# Patient Record
Sex: Male | Born: 1970 | Race: White | Hispanic: No | Marital: Married | Smoking: Never smoker
Health system: Southern US, Community
[De-identification: ages and names within clinical notes are randomized; demographics above are authoritative.]

## PROBLEM LIST (undated history)

## (undated) DIAGNOSIS — Q7649 Other congenital malformations of spine, not associated with scoliosis: Secondary | ICD-10-CM

## (undated) DIAGNOSIS — G4730 Sleep apnea, unspecified: Secondary | ICD-10-CM

## (undated) DIAGNOSIS — M1990 Unspecified osteoarthritis, unspecified site: Secondary | ICD-10-CM

---

## 2001-01-01 ENCOUNTER — Ambulatory Visit (HOSPITAL_COMMUNITY): Admission: RE | Admit: 2001-01-01 | Discharge: 2001-01-01 | Payer: Self-pay | Admitting: Obstetrics and Gynecology

## 2018-12-22 ENCOUNTER — Inpatient Hospital Stay (HOSPITAL_COMMUNITY)
Admission: EM | Admit: 2018-12-22 | Discharge: 2018-12-27 | DRG: 177 | Disposition: A | Discharge status: Home / Self Care | Payer: 59 | Source: Ambulatory Visit | Attending: Family Medicine | Admitting: Family Medicine

## 2018-12-22 ENCOUNTER — Emergency Department (HOSPITAL_COMMUNITY): Payer: 59

## 2018-12-22 ENCOUNTER — Encounter (HOSPITAL_COMMUNITY): Payer: Self-pay

## 2018-12-22 ENCOUNTER — Other Ambulatory Visit: Payer: Self-pay

## 2018-12-22 DIAGNOSIS — T380X5A Adverse effect of glucocorticoids and synthetic analogues, initial encounter: Secondary | ICD-10-CM | POA: Diagnosis not present

## 2018-12-22 DIAGNOSIS — F419 Anxiety disorder, unspecified: Secondary | ICD-10-CM | POA: Diagnosis present

## 2018-12-22 DIAGNOSIS — J1289 Other viral pneumonia: Secondary | ICD-10-CM | POA: Diagnosis present

## 2018-12-22 DIAGNOSIS — R079 Chest pain, unspecified: Secondary | ICD-10-CM

## 2018-12-22 DIAGNOSIS — Q7649 Other congenital malformations of spine, not associated with scoliosis: Secondary | ICD-10-CM | POA: Diagnosis not present

## 2018-12-22 DIAGNOSIS — U071 COVID-19: Principal | ICD-10-CM | POA: Insufficient documentation

## 2018-12-22 DIAGNOSIS — Z23 Encounter for immunization: Secondary | ICD-10-CM | POA: Diagnosis not present

## 2018-12-22 DIAGNOSIS — R0902 Hypoxemia: Secondary | ICD-10-CM | POA: Diagnosis not present

## 2018-12-22 DIAGNOSIS — Z885 Allergy status to narcotic agent status: Secondary | ICD-10-CM

## 2018-12-22 DIAGNOSIS — R739 Hyperglycemia, unspecified: Secondary | ICD-10-CM | POA: Diagnosis present

## 2018-12-22 DIAGNOSIS — G629 Polyneuropathy, unspecified: Secondary | ICD-10-CM | POA: Diagnosis present

## 2018-12-22 DIAGNOSIS — Z79899 Other long term (current) drug therapy: Secondary | ICD-10-CM | POA: Diagnosis not present

## 2018-12-22 DIAGNOSIS — M1990 Unspecified osteoarthritis, unspecified site: Secondary | ICD-10-CM | POA: Diagnosis present

## 2018-12-22 DIAGNOSIS — I517 Cardiomegaly: Secondary | ICD-10-CM | POA: Diagnosis present

## 2018-12-22 DIAGNOSIS — J9601 Acute respiratory failure with hypoxia: Secondary | ICD-10-CM | POA: Diagnosis present

## 2018-12-22 DIAGNOSIS — Z6838 Body mass index (BMI) 38.0-38.9, adult: Secondary | ICD-10-CM | POA: Diagnosis not present

## 2018-12-22 DIAGNOSIS — G4730 Sleep apnea, unspecified: Secondary | ICD-10-CM | POA: Diagnosis present

## 2018-12-22 DIAGNOSIS — R7303 Prediabetes: Secondary | ICD-10-CM | POA: Diagnosis present

## 2018-12-22 DIAGNOSIS — E669 Obesity, unspecified: Secondary | ICD-10-CM | POA: Diagnosis present

## 2018-12-22 DIAGNOSIS — R0602 Shortness of breath: Secondary | ICD-10-CM

## 2018-12-22 HISTORY — DX: Unspecified osteoarthritis, unspecified site: M19.90

## 2018-12-22 HISTORY — DX: Other congenital malformations of spine, not associated with scoliosis: Q76.49

## 2018-12-22 HISTORY — DX: Sleep apnea, unspecified: G47.30

## 2018-12-22 LAB — COMPREHENSIVE METABOLIC PANEL
ALT: 23 U/L (ref 0–44)
AST: 21 U/L (ref 15–41)
Albumin: 3.7 g/dL (ref 3.5–5.0)
Alkaline Phosphatase: 86 U/L (ref 38–126)
Anion gap: 9 (ref 5–15)
BUN: 8 mg/dL (ref 6–20)
CO2: 23 mmol/L (ref 22–32)
Calcium: 9.1 mg/dL (ref 8.9–10.3)
Chloride: 106 mmol/L (ref 98–111)
Creatinine, Ser: 0.74 mg/dL (ref 0.61–1.24)
GFR calc Af Amer: >60 mL/min (ref >60)
GFR calc non Af Amer: >60 mL/min (ref >60)
Glucose, Bld: 169 mg/dL — ABNORMAL HIGH (ref 70–99)
Potassium: 4.2 mmol/L (ref 3.5–5.1)
Sodium: 138 mmol/L (ref 135–145)
Total Bilirubin: 0.8 mg/dL (ref 0.3–1.2)
Total Protein: 6.8 g/dL (ref 6.5–8.1)

## 2018-12-22 LAB — CBC WITH DIFFERENTIAL/PLATELET
Abs Immature Granulocytes: 0.03 10*3/uL (ref 0.00–0.07)
Basophils Absolute: 0 10*3/uL (ref 0.0–0.1)
Basophils Relative: 0 %
Eosinophils Absolute: 0.1 10*3/uL (ref 0.0–0.5)
Eosinophils Relative: 1 %
HCT: 46.6 % (ref 39.0–52.0)
Hemoglobin: 15.4 g/dL (ref 13.0–17.0)
Immature Granulocytes: 0 %
Lymphocytes Relative: 15 %
Lymphs Abs: 1.2 10*3/uL (ref 0.7–4.0)
MCH: 29.8 pg (ref 26.0–34.0)
MCHC: 33 g/dL (ref 30.0–36.0)
MCV: 90.1 fL (ref 80.0–100.0)
Monocytes Absolute: 0.9 10*3/uL (ref 0.1–1.0)
Monocytes Relative: 11 %
Neutro Abs: 5.9 10*3/uL (ref 1.7–7.7)
Neutrophils Relative %: 73 %
Platelets: 168 10*3/uL (ref 150–400)
RBC: 5.17 MIL/uL (ref 4.22–5.81)
RDW: 11.8 % (ref 11.5–15.5)
WBC: 8.1 10*3/uL (ref 4.0–10.5)
nRBC: 0 % (ref 0.0–0.2)

## 2018-12-22 LAB — TROPONIN I (HIGH SENSITIVITY)
Troponin I (High Sensitivity): 3 ng/L (ref <18)
Troponin I (High Sensitivity): 6 ng/L (ref <18)

## 2018-12-22 LAB — D-DIMER, QUANTITATIVE: D-Dimer, Quant: 0.56 ug/mL-FEU — ABNORMAL HIGH (ref 0.00–0.50)

## 2018-12-22 LAB — FIBRINOGEN: Fibrinogen: 583 mg/dL — ABNORMAL HIGH (ref 210–475)

## 2018-12-22 LAB — C-REACTIVE PROTEIN: CRP: 7.7 mg/dL — ABNORMAL HIGH (ref <1.0)

## 2018-12-22 LAB — FERRITIN: Ferritin: 251 ng/mL (ref 24–336)

## 2018-12-22 LAB — LACTIC ACID, PLASMA
Lactic Acid, Venous: 1.4 mmol/L (ref 0.5–1.9)
Lactic Acid, Venous: 1.4 mmol/L (ref 0.5–1.9)

## 2018-12-22 LAB — LACTATE DEHYDROGENASE: LDH: 159 U/L (ref 98–192)

## 2018-12-22 LAB — PROCALCITONIN: Procalcitonin: <0.1 ng/mL

## 2018-12-22 LAB — TRIGLYCERIDES: Triglycerides: 205 mg/dL — ABNORMAL HIGH (ref <150)

## 2018-12-22 LAB — LIPASE, BLOOD: Lipase: 35 U/L (ref 11–51)

## 2018-12-22 LAB — SARS CORONAVIRUS 2 (TAT 6-24 HRS): SARS Coronavirus 2: POSITIVE — AB

## 2018-12-22 LAB — HIV ANTIBODY (ROUTINE TESTING W REFLEX): HIV Screen 4th Generation wRfx: NONREACTIVE

## 2018-12-22 MED ORDER — SODIUM CHLORIDE 0.9% FLUSH
3.0000 mL | Freq: Two times a day (BID) | INTRAVENOUS | Status: DC
  Administered 2018-12-23 – 2018-12-27 (×9): 3 mL via INTRAVENOUS

## 2018-12-22 MED ORDER — ASPIRIN EC 81 MG PO TBEC
81.0000 mg | DELAYED_RELEASE_TABLET | Freq: Two times a day (BID) | ORAL | Status: DC
  Administered 2018-12-22 – 2018-12-27 (×10): 81 mg via ORAL
  Filled 2018-12-22 (×10): qty 1

## 2018-12-22 MED ORDER — SODIUM CHLORIDE 0.9 % IV SOLN: 250.0000 mL | INTRAVENOUS | Status: DC | PRN

## 2018-12-22 MED ORDER — ONDANSETRON HCL 4 MG PO TABS: 4.0000 mg | ORAL_TABLET | Freq: Four times a day (QID) | ORAL | Status: DC | PRN

## 2018-12-22 MED ORDER — IOHEXOL 350 MG/ML SOLN
75.0000 mL | Freq: Once | INTRAVENOUS | Status: AC | PRN
  Administered 2018-12-22: 75 mL via INTRAVENOUS

## 2018-12-22 MED ORDER — METHYLPREDNISOLONE SODIUM SUCC 125 MG IJ SOLR
60.0000 mg | Freq: Three times a day (TID) | INTRAMUSCULAR | Status: DC
  Administered 2018-12-22 – 2018-12-23 (×3): 60 mg via INTRAVENOUS
  Filled 2018-12-22 (×3): qty 2

## 2018-12-22 MED ORDER — ONDANSETRON HCL 4 MG/2ML IJ SOLN: 4.0000 mg | Freq: Four times a day (QID) | INTRAMUSCULAR | Status: DC | PRN

## 2018-12-22 MED ORDER — SODIUM CHLORIDE 0.9% FLUSH: 3.0000 mL | INTRAVENOUS | Status: DC | PRN

## 2018-12-22 MED ORDER — ACETAMINOPHEN 500 MG PO TABS: 1000.0000 mg | ORAL_TABLET | Freq: Four times a day (QID) | ORAL | Status: DC | PRN

## 2018-12-22 MED ORDER — ACETAMINOPHEN 650 MG RE SUPP: 650.0000 mg | Freq: Four times a day (QID) | RECTAL | Status: DC | PRN

## 2018-12-22 MED ORDER — ACETAMINOPHEN 325 MG PO TABS
650.0000 mg | ORAL_TABLET | Freq: Four times a day (QID) | ORAL | Status: DC | PRN
  Administered 2018-12-22 – 2018-12-26 (×9): 650 mg via ORAL
  Filled 2018-12-22 (×9): qty 2

## 2018-12-22 MED ORDER — ENOXAPARIN SODIUM 40 MG/0.4ML ~~LOC~~ SOLN
40.0000 mg | SUBCUTANEOUS | Status: DC
  Administered 2018-12-22 – 2018-12-24 (×3): 40 mg via SUBCUTANEOUS
  Filled 2018-12-22 (×3): qty 0.4

## 2018-12-22 MED ORDER — CLONAZEPAM 1 MG PO TABS
1.0000 mg | ORAL_TABLET | Freq: Every evening | ORAL | Status: DC | PRN
  Administered 2018-12-22 – 2018-12-26 (×5): 1 mg via ORAL
  Filled 2018-12-22 (×2): qty 1
  Filled 2018-12-22: qty 2
  Filled 2018-12-22 (×2): qty 1

## 2018-12-22 MED ORDER — HYDROCODONE-ACETAMINOPHEN 5-325 MG PO TABS
1.0000 | ORAL_TABLET | ORAL | Status: DC | PRN
  Administered 2018-12-23: 1 via ORAL
  Administered 2018-12-23: 2 via ORAL
  Administered 2018-12-25: 1 via ORAL
  Filled 2018-12-22 (×2): qty 1
  Filled 2018-12-22: qty 2

## 2018-12-22 NOTE — ED Triage Notes (Signed)
Covid +, patient was called today and told to come to hospital r/t chest xray and went back today and had another chest xray and should come here

## 2018-12-22 NOTE — ED Notes (Signed)
Verified positive Covid result with records on [facility] with [facility].

## 2018-12-22 NOTE — ED Notes (Signed)
Transported to Ct scan

## 2018-12-22 NOTE — ED Provider Notes (Signed)
[place] EMERGENCY DEPARTMENT Provider Note   CSN: [id number] Arrival date & time: 12/22/18  1312     History   Chief Complaint Chief Complaint  Patient presents with   Shortness of Breath    HPI Jeffery Lloyd is a 48 y.o. male.     The history is provided by the patient and medical records. No language interpreter was used.  Shortness of Breath Severity:  Severe Onset quality:  Gradual Duration:  3 days Timing:  Constant Progression:  Worsening Chronicity:  New Context: URI   Relieved by:  Nothing Worsened by:  Deep breathing Ineffective treatments:  None tried Associated symptoms: chest pain, cough and sputum production   Associated symptoms: no abdominal pain, no diaphoresis, no fever, no headaches, no vomiting and no wheezing     No past medical history on file.  There are no active problems to display for this patient.     Home Medications    Prior to Admission medications   Not on File    Family History No family history on file.  Social History Social History   Tobacco Use   Smoking status: Not on file  Substance Use Topics   Alcohol use: Not on file   Drug use: Not on file     Allergies   Patient has no allergy information on record.   Review of Systems Review of Systems  Constitutional: Negative for chills, diaphoresis, fatigue and fever.  HENT: Negative for congestion.   Respiratory: Positive for cough, sputum production, chest tightness and shortness of breath. Negative for wheezing and stridor.   Cardiovascular: Positive for chest pain. Negative for palpitations and leg swelling.  Gastrointestinal: Negative for abdominal pain, constipation, diarrhea, nausea and vomiting.  Genitourinary: Negative for flank pain.  Musculoskeletal: Negative for back pain.  Neurological: Negative for headaches.  Psychiatric/Behavioral: Negative for agitation.  All other systems reviewed and are negative.    Physical  Exam Updated Vital Signs BP (!) 139/100    Pulse 98    Temp 98.3 F (36.8 C) (Oral)    Resp 18    SpO2 94%   Physical Exam Vitals signs and nursing note reviewed.  Constitutional:      General: He is not in acute distress.    Appearance: He is not ill-appearing, toxic-appearing or diaphoretic.  HENT:     Head: Normocephalic.     Mouth/Throat:     Mouth: Mucous membranes are moist.     Pharynx: No pharyngeal swelling or oropharyngeal exudate.  Eyes:     Pupils: Pupils are equal, round, and reactive to light.  Cardiovascular:     Rate and Rhythm: Regular rhythm. Tachycardia present.  Pulmonary:     Effort: Tachypnea present.     Breath sounds: No stridor. Examination of the right-lower field reveals rhonchi. Examination of the left-lower field reveals rhonchi. Rhonchi present. No decreased breath sounds or rales.  Chest:     Chest wall: No tenderness.  Musculoskeletal:     Right lower leg: He exhibits no tenderness. No edema.     Left lower leg: He exhibits no tenderness. No edema.  Skin:    General: Skin is warm.     Capillary Refill: Capillary refill takes less than 2 seconds.  Neurological:     General: No focal deficit present.     Mental Status: He is alert.      ED Treatments / Results  Labs (all labs ordered are listed, but only abnormal results  are displayed) Labs Reviewed  COMPREHENSIVE METABOLIC PANEL - Abnormal; Notable for the following components:      Result Value   Glucose, Bld 169 (*)    All other components within normal limits  CULTURE, BLOOD (ROUTINE X 2)  CULTURE, BLOOD (ROUTINE X 2)  CBC WITH DIFFERENTIAL/PLATELET  LIPASE, BLOOD  LACTIC ACID, PLASMA  LACTIC ACID, PLASMA  D-DIMER, QUANTITATIVE (NOT AT [facility])  PROCALCITONIN  LACTATE DEHYDROGENASE  FERRITIN  TRIGLYCERIDES  FIBRINOGEN  C-REACTIVE PROTEIN  TROPONIN I (HIGH SENSITIVITY)  TROPONIN I (HIGH SENSITIVITY)    EKG EKG Interpretation  Date/Time:  Sunday December 22 2018 13:31:43  EST Ventricular Rate:  118 PR Interval:    QRS Duration: 98 QT Interval:  312 QTC Calculation: 438 R Axis:   117 Text Interpretation: Sinus tachycardia LAE, consider biatrial enlargement Right ventricular hypertrophy Baseline wander in lead(s) V1 No prior ECG for comparison. No STEMI Confirmed by Theda Belfastegeler, Chris ([id number]) on 12/22/2018 1:35:36 PM   Radiology Ct Angio Chest Pe W And/or Wo Contrast  Result Date: 12/22/2018 CLINICAL DATA:  Shortness of breath, COVID-19 positive, abnormal chest x-ray EXAM: CT ANGIOGRAPHY CHEST WITH CONTRAST TECHNIQUE: Multidetector CT imaging of the chest was performed using the standard protocol during bolus administration of intravenous contrast. Multiplanar CT image reconstructions and MIPs were obtained to evaluate the vascular anatomy. CONTRAST:  75mL OMNIPAQUE IOHEXOL 350 MG/ML SOLN COMPARISON:  Same day chest radiograph FINDINGS: Cardiovascular: Satisfactory opacification of the pulmonary arteries to the segmental level. No evidence of pulmonary embolism. Normal heart size. No pericardial effusion. Mediastinum/Nodes: No enlarged mediastinal, hilar, or axillary lymph nodes. Thyroid gland, trachea, and esophagus demonstrate no significant findings. Lungs/Pleura: There is extensive bilateral heterogeneous and ground-glass nodule airspace opacity, many opacities in a subpleural distribution. No pleural effusion or pneumothorax. Upper Abdomen: No acute abnormality. Musculoskeletal: No chest wall abnormality. 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 Review of the MIP images confirms the above findings. IMPRESSION: 1.  Negative examination for pulmonary embolism. 2. There is extensive bilateral heterogeneous and ground-glass nodule airspace opacity, many opacities in a subpleural distribution, airspace disease generally in keeping with reported diagnosis of COVID-19. Electronically Signed   By: Lauralyn PrimesAlex  Bibbey M.D.   On: 12/22/2018 15:26     Procedures Procedures (including critical care time)  Jeffery ParmaMichael Sliva was evaluated in Emergency Department on 12/22/2018 for the symptoms described in the history of present illness. He was evaluated in the context of the global COVID-19 pandemic, which necessitated consideration that the patient might be at risk for infection with the SARS-CoV-2 virus that causes COVID-19. Institutional protocols and algorithms that pertain to the evaluation of patients at risk for COVID-19 are in a state of rapid change based on information released by regulatory bodies including the CDC and federal and state organizations. These policies and algorithms were followed during the patient's care in the ED.   Medications Ordered in ED Medications  iohexol (OMNIPAQUE) 350 MG/ML injection 75 mL (75 mLs Intravenous Contrast Given 12/22/18 1514)     Initial Impression / Assessment and Plan / ED Course  I have reviewed the triage vital signs and the nursing notes.  Pertinent labs & imaging results that were available during my care of the patient were reviewed by me and considered in my medical decision making (see chart for details).        Jeffery ParmaMichael Lloyd is a 48 y.o. male recent diagnosis of COVID-19 who presents from PCP for further evaluation and rule out of  pulmonary embolism with worsening chest x-ray.  Patient reports that he was diagnosed with COVID-19 several days ago and the rest of his family are all infected.  He reports that he has a pulse oximeter at home and was found to have oxygen saturations dropping into the mid 80s today.  He reports shortness of breath was worsening and a chest x-ray performed today at PCP showed worsening bilateral lung abnormalities.  They were concerned about development of pulmonary embolism given the new left-sided chest pain, shortness of breath, hypoxia, tachycardia, and tachypnea.  Patient reports no nausea, vomiting, diaphoresis.  He denies any urinary symptoms or GI  symptoms.  He does not take oxygen at home and again had the oxygen saturations in the 80s previously.  On exam, patient does have some coarse breath sounds in the bases bilaterally right worse than left.  Chest was nontender.  Abdomen was nontender.  Good pulses in extremities.  Patient has some mild edema in his lower extremities which he reports is at his baseline.  His history of lower extremity surgeries most recently several months ago.  He denies history of DVT or PE and has had previous ultrasounds of his legs in the past.  EKG shows no STEMI but does show sinus tachycardia.  Clinically I am concerned about pneumonia versus pulmonary embolism versus symptomatic COVID-19 infection.  Will ambulate patient with pulse oximetry to see if he gets hypoxic given the hypoxia at home.  If he is hypoxic, anticipate admission regardless of imaging findings.  Anticipate reassessment after work-up.  Patient was ambulated in his room and oxygen saturation was around 90.  Heart rate did jump into the 150s and he was very symptomatic.  Given the low oxygen saturations in the 85% at home, patient will place on oxygen.  CT scan showed is no pulmonary motion.  Diffuse lung disease is seen with from COVID-19.  Due to new oxygen requirement and COVID-19 infection, patient will be admitted for further management oxygen supplementation.  Will defer to admitting team for antibiotics.  Final Clinical Impressions(s) / ED Diagnoses   Final diagnoses:  Hypoxia  SOB (shortness of breath)  Chest pain, unspecified type  COVID-19     Clinical Impression: 1. Hypoxia   2. SOB (shortness of breath)   3. Chest pain, unspecified type   4. COVID-19     Disposition: Admit  This note was prepared with assistance of Dragon voice recognition software. Occasional wrong-word or sound-a-like substitutions may have occurred due to the inherent limitations of voice recognition software.      Gearldene Fiorenza, Canary Brim,  MD 12/22/18 [phone]

## 2018-12-22 NOTE — ED Notes (Signed)
Called [facility] for transport to [facility] 

## 2018-12-22 NOTE — H&P (Signed)
[facility]                                                                                    Patient Demographics  Jeffery Lloyd, is a 48 y.o. male  CSN: [id number]  MRN: [id number]  DOB - 24-Jan-1971  Admit Date - 12/22/2018  Outpatient Primary MD for the patient is Patient, No Pcp Per   With History of -  Past Medical History:  Diagnosis Date  . Arthritis   . Sacral agenesis         in for   Chief Complaint  Patient presents with  . Shortness of Breath     HPI  Jeffery Lloyd  is a 48 y.o. male, with past medical history significant for arthritis and sacral agenesis and recent diagnosis of COVID-19 on Monday, sent from [place] office for evaluation for hypoxemia, noted today.  Chest x-ray at his PCPs office showed worsening bilateral lung abnormalities from a previous chest x-ray.  Patient reports left-sided pleuritic chest pain with shortness of breath.  No nausea vomiting or diarrhea reported.  Symptoms improved on nasal cannula BC and BMP were within normal, chest CT was negative for pulmonary embolism with extensive bilateral heterogeneous and groundglass nodule airspace opacities suggestive of COVID-19 .  Patient was noted to be tachypneic and tachycardic in the ER     Review of Systems    In addition to the HPI above,  Reports feverishness at home No Headache, No changes with Vision or hearing, No problems swallowing food or Liquids, No Abdominal pain, No Nausea or Vommitting, Bowel movements are regular, No Blood in stool or Urine, No dysuria, No new skin rashes or bruises, No new joints pains-aches,  No new weakness, tingling, numbness in any extremity, No recent weight gain or loss, No polyuria, polydypsia or polyphagia, No significant Mental Stressors.  A full 10 point Review of Systems was done, except as stated above, all other Review of Systems were negative.   Social History Social History   Tobacco Use  . Smoking status: Never  Smoker  Substance Use Topics  . Alcohol use: Yes    Alcohol/week: 2.0 standard drinks    Types: 2 Cans of beer per week     Family History No family history on file.   Prior to Admission medications   Medication Sig Start Date End Date Taking? Authorizing Provider  acetaminophen (TYLENOL) 500 MG tablet Take 1,000 mg by mouth every 6 (six) hours as needed for mild pain or headache.   Yes [provider]  cholecalciferol (VITAMIN D3) 25 MCG (1000 UT) tablet Take 2,000 Units by mouth daily.   Yes [provider]  clonazePAM (KLONOPIN) 1 MG tablet Take 1 mg by mouth at bedtime as needed for sleep. 07/10/18  Yes [provider]  dextromethorphan-guaiFENesin (MUCINEX DM) 30-600 MG 12hr tablet Take 1 tablet by mouth 2 (two) times daily as needed for cough.   Yes [provider]  fexofenadine (ALLEGRA) 180 MG tablet Take 180 mg by mouth daily as needed for allergies or rhinitis.   Yes [provider]  ibuprofen (ADVIL) 200 MG tablet Take 600 mg by  mouth every 6 (six) hours as needed for headache or moderate pain.   Yes [provider]  meloxicam (MOBIC) 15 MG tablet Take 15 mg by mouth daily as needed for pain. 11/08/18  Yes [provider]  Omega-3 Fatty Acids (FISH OIL) 1000 MG CAPS Take 1,000 mg by mouth daily.   Yes [provider]  vitamin C (ASCORBIC ACID) 500 MG tablet Take 500 mg by mouth daily.   Yes [provider]  zinc gluconate 50 MG tablet Take 50 mg by mouth daily.   Yes [provider]    Allergies  Allergen Reactions  . Codeine     Physical Exam  Vitals  Blood pressure (!) 139/100, pulse 98, temperature 98.3 F (36.8 C), temperature source Oral, resp. rate 18, SpO2 94 %. General appearance, no acute distress, alert awake oriented x3 HEENT no jaundice or pallor, no facial deviation oral thrush Neck supple, no neck vein distention Chest scattered rhonchi  Heart normal S1-S2 Abdomen  soft, nontender, bowel sounds are present Extremities no clubbing cyanosis +1 edema reported Skin no rashes or ulcers Neuro gross nonfocal, patient moving all extremities     Data Review  CBC Recent Labs  Lab 12/22/18 1406  WBC 8.1  HGB 15.4  HCT 46.6  PLT 168  MCV 90.1  MCH 29.8  MCHC 33.0  RDW 11.8  LYMPHSABS 1.2  MONOABS 0.9  EOSABS 0.1  BASOSABS 0.0   ------------------------------------------------------------------------------------------------------------------  Chemistries  Recent Labs  Lab 12/22/18 1406  NA 138  K 4.2  CL 106  CO2 23  GLUCOSE 169*  BUN 8  CREATININE 0.74  CALCIUM 9.1  AST 21  ALT 23  ALKPHOS 86  BILITOT 0.8   ------------------------------------------------------------------------------------------------------------------ CrCl cannot be calculated (Unknown ideal weight.). ------------------------------------------------------------------------------------------------------------------ No results for input(s): TSH, T4TOTAL, T3FREE, THYROIDAB in the last 72 hours.  Invalid input(s): FREET3   Coagulation profile No results for input(s): INR, PROTIME in the last 168 hours. ------------------------------------------------------------------------------------------------------------------- No results for input(s): DDIMER in the last 72 hours. -------------------------------------------------------------------------------------------------------------------  Cardiac Enzymes No results for input(s): CKMB, TROPONINI, MYOGLOBIN in the last 168 hours.  Invalid input(s): CK ------------------------------------------------------------------------------------------------------------------ Invalid input(s): POCBNP   ---------------------------------------------------------------------------------------------------------------  Urinalysis No results found for: COLORURINE, APPEARANCEUR, LABSPEC, PHURINE, GLUCOSEU, HGBUR, BILIRUBINUR,  KETONESUR, PROTEINUR, UROBILINOGEN, NITRITE, LEUKOCYTESUR  ----------------------------------------------------------------------------------------------------------------   Imaging results:   Ct Angio Chest Pe W And/or Wo Contrast  Result Date: 12/22/2018 CLINICAL DATA:  Shortness of breath, COVID-19 positive, abnormal chest x-ray EXAM: CT ANGIOGRAPHY CHEST WITH CONTRAST TECHNIQUE: Multidetector CT imaging of the chest was performed using the standard protocol during bolus administration of intravenous contrast. Multiplanar CT image reconstructions and MIPs were obtained to evaluate the vascular anatomy. CONTRAST:  75mL OMNIPAQUE IOHEXOL 350 MG/ML SOLN COMPARISON:  Same day chest radiograph FINDINGS: Cardiovascular: Satisfactory opacification of the pulmonary arteries to the segmental level. No evidence of pulmonary embolism. Normal heart size. No pericardial effusion. Mediastinum/Nodes: No enlarged mediastinal, hilar, or axillary lymph nodes. Thyroid gland, trachea, and esophagus demonstrate no significant findings. Lungs/Pleura: There is extensive bilateral heterogeneous and ground-glass nodule airspace opacity, many opacities in a subpleural distribution. No pleural effusion or pneumothorax. Upper Abdomen: No acute abnormality. Musculoskeletal: No chest wall abnormality. 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 Review of the MIP images confirms the above findings. IMPRESSION: 1.  Negative examination for pulmonary embolism. 2. There is extensive bilateral heterogeneous and ground-glass nodule airspace opacity, many opacities in a subpleural distribution, airspace disease generally in keeping with  reported diagnosis of COVID-19. Electronically Signed   By: Lauralyn Primes M.D.   On: 12/22/2018 15:26    My personal review of EKG: Sinus tach at 180 bpm with right ventricular hypertrophy  Assessment & Plan  COVID-19 pneumonia with new oxygen requirement Will place on steroids and  transfer to [facility]  Arthritis Continue with pain control  Hyperglycemia Start on carb modified diet    DVT Prophylaxis Lovenox AM Labs Ordered, also please review Full Orders  Family Communication:   Code Status full  Disposition Plan: Home  Time spent in minutes : 42 minutes  Condition GUARDED   @SIGNATURE @

## 2018-12-22 NOTE — ED Notes (Signed)
Ambulated patient, o2 sats remained 93%.  HR increased to 148 during ambulation.

## 2018-12-23 ENCOUNTER — Other Ambulatory Visit: Payer: Self-pay

## 2018-12-23 ENCOUNTER — Encounter (HOSPITAL_COMMUNITY): Payer: Self-pay

## 2018-12-23 DIAGNOSIS — R0902 Hypoxemia: Secondary | ICD-10-CM

## 2018-12-23 LAB — BASIC METABOLIC PANEL
Anion gap: 12 (ref 5–15)
BUN: 14 mg/dL (ref 6–20)
CO2: 24 mmol/L (ref 22–32)
Calcium: 9.3 mg/dL (ref 8.9–10.3)
Chloride: 102 mmol/L (ref 98–111)
Creatinine, Ser: 0.68 mg/dL (ref 0.61–1.24)
GFR calc Af Amer: >60 mL/min (ref >60)
GFR calc non Af Amer: >60 mL/min (ref >60)
Glucose, Bld: 171 mg/dL — ABNORMAL HIGH (ref 70–99)
Potassium: 4.2 mmol/L (ref 3.5–5.1)
Sodium: 138 mmol/L (ref 135–145)

## 2018-12-23 LAB — HEMOGLOBIN A1C
Hgb A1c MFr Bld: 5.9 % — ABNORMAL HIGH (ref 4.8–5.6)
Mean Plasma Glucose: 122.63 mg/dL

## 2018-12-23 LAB — CBC WITH DIFFERENTIAL/PLATELET
Abs Immature Granulocytes: 0.06 10*3/uL (ref 0.00–0.07)
Basophils Absolute: 0 10*3/uL (ref 0.0–0.1)
Basophils Relative: 0 %
Eosinophils Absolute: 0 10*3/uL (ref 0.0–0.5)
Eosinophils Relative: 0 %
HCT: 48.6 % (ref 39.0–52.0)
Hemoglobin: 15.7 g/dL (ref 13.0–17.0)
Immature Granulocytes: 1 %
Lymphocytes Relative: 10 %
Lymphs Abs: 1.1 10*3/uL (ref 0.7–4.0)
MCH: 29.1 pg (ref 26.0–34.0)
MCHC: 32.3 g/dL (ref 30.0–36.0)
MCV: 90.2 fL (ref 80.0–100.0)
Monocytes Absolute: 0.3 10*3/uL (ref 0.1–1.0)
Monocytes Relative: 2 %
Neutro Abs: 9.2 10*3/uL — ABNORMAL HIGH (ref 1.7–7.7)
Neutrophils Relative %: 87 %
Platelets: 232 10*3/uL (ref 150–400)
RBC: 5.39 MIL/uL (ref 4.22–5.81)
RDW: 11.9 % (ref 11.5–15.5)
WBC: 10.6 10*3/uL — ABNORMAL HIGH (ref 4.0–10.5)
nRBC: 0 % (ref 0.0–0.2)

## 2018-12-23 LAB — FERRITIN: Ferritin: 256 ng/mL (ref 24–336)

## 2018-12-23 LAB — SAMPLE TO BLOOD BANK

## 2018-12-23 LAB — GLUCOSE, CAPILLARY
Glucose-Capillary: 237 mg/dL — ABNORMAL HIGH (ref 70–99)
Glucose-Capillary: 238 mg/dL — ABNORMAL HIGH (ref 70–99)

## 2018-12-23 LAB — C-REACTIVE PROTEIN: CRP: 8.3 mg/dL — ABNORMAL HIGH (ref <1.0)

## 2018-12-23 LAB — D-DIMER, QUANTITATIVE: D-Dimer, Quant: 0.66 ug/mL-FEU — ABNORMAL HIGH (ref 0.00–0.50)

## 2018-12-23 MED ORDER — DEXAMETHASONE SODIUM PHOSPHATE 10 MG/ML IJ SOLN
6.0000 mg | INTRAMUSCULAR | Status: DC
  Administered 2018-12-24 – 2018-12-27 (×4): 6 mg via INTRAVENOUS
  Filled 2018-12-23 (×4): qty 1

## 2018-12-23 MED ORDER — VITAMIN C 500 MG PO TABS
1000.0000 mg | ORAL_TABLET | Freq: Every day | ORAL | Status: DC
  Administered 2018-12-23 – 2018-12-27 (×5): 1000 mg via ORAL
  Filled 2018-12-23 (×5): qty 2

## 2018-12-23 MED ORDER — MELATONIN 3 MG PO TABS
3.0000 mg | ORAL_TABLET | Freq: Every day | ORAL | Status: DC
  Administered 2018-12-23 – 2018-12-26 (×4): 3 mg via ORAL
  Filled 2018-12-23 (×6): qty 1

## 2018-12-23 MED ORDER — ZINC SULFATE 220 (50 ZN) MG PO CAPS
220.0000 mg | ORAL_CAPSULE | Freq: Every day | ORAL | Status: DC
  Administered 2018-12-23 – 2018-12-27 (×5): 220 mg via ORAL
  Filled 2018-12-23 (×5): qty 1

## 2018-12-23 MED ORDER — INSULIN ASPART 100 UNIT/ML ~~LOC~~ SOLN
0.0000 [IU] | Freq: Every day | SUBCUTANEOUS | Status: DC
  Administered 2018-12-23: 2 [IU] via SUBCUTANEOUS
  Administered 2018-12-24: 3 [IU] via SUBCUTANEOUS
  Administered 2018-12-25: 2 [IU] via SUBCUTANEOUS

## 2018-12-23 MED ORDER — LACTATED RINGERS IV BOLUS
500.0000 mL | Freq: Once | INTRAVENOUS | Status: AC
  Administered 2018-12-23: 500 mL via INTRAVENOUS

## 2018-12-23 MED ORDER — SODIUM CHLORIDE 0.9 % IV SOLN
100.0000 mg | INTRAVENOUS | Status: AC
  Administered 2018-12-24 – 2018-12-27 (×4): 100 mg via INTRAVENOUS
  Filled 2018-12-23: qty 100
  Filled 2018-12-23 (×3): qty 20

## 2018-12-23 MED ORDER — METHYLPREDNISOLONE SODIUM SUCC 125 MG IJ SOLR
60.0000 mg | Freq: Two times a day (BID) | INTRAMUSCULAR | Status: AC
  Administered 2018-12-23: 60 mg via INTRAVENOUS
  Filled 2018-12-23: qty 2

## 2018-12-23 MED ORDER — INSULIN ASPART 100 UNIT/ML ~~LOC~~ SOLN
0.0000 [IU] | Freq: Three times a day (TID) | SUBCUTANEOUS | Status: DC
  Administered 2018-12-23: 3 [IU] via SUBCUTANEOUS
  Administered 2018-12-24: 2 [IU] via SUBCUTANEOUS
  Administered 2018-12-24: 3 [IU] via SUBCUTANEOUS
  Administered 2018-12-24: 2 [IU] via SUBCUTANEOUS
  Administered 2018-12-25: 3 [IU] via SUBCUTANEOUS
  Administered 2018-12-25: 2 [IU] via SUBCUTANEOUS
  Administered 2018-12-25: 1 [IU] via SUBCUTANEOUS

## 2018-12-23 MED ORDER — SODIUM CHLORIDE 0.9 % IV SOLN
200.0000 mg | Freq: Once | INTRAVENOUS | Status: AC
  Administered 2018-12-23: 200 mg via INTRAVENOUS
  Filled 2018-12-23: qty 40

## 2018-12-23 NOTE — ED Notes (Signed)
Pt placed on 2L [place], saturations dropped to 86% while sleeping.

## 2018-12-23 NOTE — Progress Notes (Signed)
PROGRESS NOTE    Jeffery Lloyd  OZY:[id number] DOB: Mar 23, 1970 DOA: 12/22/2018 PCP: Patient, No Pcp Per   Brief Narrative:  Jeffery Lloyd  is Jeffery Lloyd 48 y.o. male, with past medical history significant for arthritis and sacral agenesis and recent diagnosis of COVID-19 on Monday, sent from PMDs office for evaluation for hypoxemia, noted today.  Chest x-ray at his PCPs office showed worsening bilateral lung abnormalities from Asmar Brozek previous chest x-ray.  Patient reports left-sided pleuritic chest pain with shortness of breath.  No nausea vomiting or diarrhea reported.  Symptoms improved on nasal cannula BC and BMP were within normal, chest CT was negative for pulmonary embolism with extensive bilateral heterogeneous and groundglass nodule airspace opacities suggestive of COVID-19 .  Patient was noted to be tachypneic and tachycardic in the ER  Assessment & Plan:   Active Problems:   Pneumonia due to COVID-19 virus   COVID-19 pneumonia with new oxygen requirement Currently on RA, satting in low 90's Steroids/remdesivir - day 1 I/O, daily weights Daily labs  Vitamin C/Zinc  CT PE protocol with bilateral heterogenous and ground glass nodule airspace opacity, many opacities in Tarez Bowns subpleural distribution - 12/22/2018  COVID-19 Labs  Recent Labs    12/22/18 1732 12/23/18 0600  DDIMER 0.56* 0.66*  FERRITIN 251 256  LDH 159  --   CRP 7.7* 8.3*    Lab Results  Component Value Date   SARSCOV2NAA POSITIVE (Tanette Chauca) 12/22/2018   Arthritis Continue with pain control  Hyperglycemia   Prediabetes Start on carb modified diet SSI  Hx Sacral Agenesis  Anxiety: continue klonopin   DVT prophylaxis: lovenox Code Status: full Family Communication: none at bedside, wife over phone Disposition Plan: pending  Consultants:   none  Procedures:  none  Antimicrobials: Anti-infectives (From admission, onward)   Start     Dose/Rate Route Frequency Ordered Stop   12/24/18 1000  remdesivir 100 mg in  sodium chloride 0.9 % 250 mL IVPB     100 mg 500 mL/hr over 30 Minutes Intravenous Every 24 hours 12/23/18 0749 12/28/18 0959   12/23/18 1000  remdesivir 200 mg in sodium chloride 0.9 % 250 mL IVPB     200 mg 500 mL/hr over 30 Minutes Intravenous Once 12/23/18 0749 12/23/18 1013         Subjective: Feels ok, still SOB, was having myalgias and chills prior to admission  Objective: Vitals:   12/23/18 0200 12/23/18 0400 12/23/18 0600 12/23/18 0800  BP: 126/67 121/81  124/66  Pulse: 84 91 (!) 107 96  Resp: (!) 21 17 20  (!) 21  Temp:   (!) 97.5 F (36.4 C) 98.4 F (36.9 C)  TempSrc:   Oral   SpO2: 94% 93%  92%  Weight:   111.9 kg   Height:   5\' 7"  (1.702 m)     Intake/Output Summary (Last 24 hours) at 12/23/2018 1543 Last data filed at 12/22/2018 1743 Gross per 24 hour  Intake --  Output 1000 ml  Net -1000 ml   Filed Weights   12/23/18 0600  Weight: 111.9 kg    Examination:  General exam: Appears calm and comfortable  Respiratory system: unlabored  Cardiovascular system: RRR Gastrointestinal system: Abdomen is nondistended, soft and nontender. Central nervous system: Alert and oriented. No focal neurological deficits. Extremities: no LEE Skin: No rashes, lesions or ulcers Psychiatry: Judgement and insight appear normal. Mood & affect appropriate.     Data Reviewed: I have personally reviewed following labs and imaging studies  CBC:  Recent Labs  Lab 12/22/18 1406 12/23/18 0553  WBC 8.1 10.6*  NEUTROABS 5.9 9.2*  HGB 15.4 15.7  HCT 46.6 48.6  MCV 90.1 90.2  PLT 168 053   Basic Metabolic Panel: Recent Labs  Lab 12/22/18 1406 12/23/18 0553  NA 138 138  K 4.2 4.2  CL 106 102  CO2 23 24  GLUCOSE 169* 171*  BUN 8 14  CREATININE 0.74 0.68  CALCIUM 9.1 9.3   GFR: Estimated Creatinine Clearance: 136.3 mL/min (by C-G formula based on SCr of 0.68 mg/dL). Liver Function Tests: Recent Labs  Lab 12/22/18 1406  AST 21  ALT 23  ALKPHOS 86  BILITOT  0.8  PROT 6.8  ALBUMIN 3.7   Recent Labs  Lab 12/22/18 1406  LIPASE 35   No results for input(s): AMMONIA in the last 168 hours. Coagulation Profile: No results for input(s): INR, PROTIME in the last 168 hours. Cardiac Enzymes: No results for input(s): CKTOTAL, CKMB, CKMBINDEX, TROPONINI in the last 168 hours. BNP (last 3 results) No results for input(s): PROBNP in the last 8760 hours. HbA1C: Recent Labs    12/23/18 0600  HGBA1C 5.9*   CBG: No results for input(s): GLUCAP in the last 168 hours. Lipid Profile: Recent Labs    12/22/18 1732  TRIG 205*   Thyroid Function Tests: No results for input(s): TSH, T4TOTAL, FREET4, T3FREE, THYROIDAB in the last 72 hours. Anemia Panel: Recent Labs    12/22/18 1732 12/23/18 0600  FERRITIN 251 256   Sepsis Labs: Recent Labs  Lab 12/22/18 1508 12/22/18 1732  PROCALCITON  --  <0.10  LATICACIDVEN 1.4 1.4    Recent Results (from the past 240 hour(s))  Blood culture (routine x 2)     Status: None (Preliminary result)   Collection Time: 12/22/18  3:08 PM   Specimen: BLOOD  Result Value Ref Range Status   Specimen Description BLOOD RIGHT ANTECUBITAL  Final   Special Requests   Final    BOTTLES DRAWN AEROBIC AND ANAEROBIC Blood Culture adequate volume   Culture   Final    NO GROWTH < 24 HOURS Performed at [facility], [place] [address], [place], [place] [address]    Report Status PENDING  Incomplete  Blood culture (routine x 2)     Status: None (Preliminary result)   Collection Time: 12/22/18  3:08 PM   Specimen: BLOOD RIGHT WRIST  Result Value Ref Range Status   Specimen Description BLOOD RIGHT WRIST  Final   Special Requests   Final    BOTTLES DRAWN AEROBIC AND ANAEROBIC Blood Culture adequate volume   Culture   Final    NO GROWTH < 24 HOURS Performed at [facility], [place] [address] [place], [place] [address]    Report Status PENDING  Incomplete  SARS CORONAVIRUS 2 (TAT 6-24 HRS) Nasopharyngeal  Nasopharyngeal Swab     Status: Abnormal   Collection Time: 12/22/18  5:25 PM   Specimen: Nasopharyngeal Swab  Result Value Ref Range Status   SARS Coronavirus 2 POSITIVE (Blessin Kanno) NEGATIVE Final    Comment: RESULT CALLED TO, READ BACK BY AND VERIFIED WITH: MORRIS T, RN AT 2211 ON 12/22/2018 BY SAINVILUS S (NOTE) SARS-CoV-2 target nucleic acids are DETECTED. The SARS-CoV-2 RNA is generally detectable in upper and lower respiratory specimens during the acute phase of infection. Positive results are indicative of active infection with SARS-CoV-2. Clinical  correlation with patient history and other diagnostic information is necessary to determine patient infection status.  Positive results do  not rule out bacterial infection or co-infection with other viruses. The expected result is Negative. Fact Sheet for Patients: HairSlick.nohttps://www.fda.gov/media/138098/download Fact Sheet for Healthcare Providers: quierodirigir.comhttps://www.fda.gov/media/138095/download This test is not yet approved or cleared by the [place] FDA and  has been authorized for detection and/or diagnosis of SARS-CoV-2 by FDA under an Emergency Use Authorization (EUA). This EUA will remain  in effect (meaning this test can  be used) for the duration of the COVID-19 declaration under Section 564(b)(1) of the Act, 21 U.S.C. section 360bbb-3(b)(1), unless the authorization is terminated or revoked sooner. Performed at [facility], [address] [place], [place] [address]          Radiology Studies: Ct Angio Chest Pe W And/or Wo Contrast  Result Date: 12/22/2018 CLINICAL DATA:  Shortness of breath, COVID-19 positive, abnormal chest x-ray EXAM: CT ANGIOGRAPHY CHEST WITH CONTRAST TECHNIQUE: Multidetector CT imaging of the chest was performed using the standard protocol during bolus administration of intravenous contrast. Multiplanar CT image reconstructions and MIPs were obtained to evaluate the vascular anatomy. CONTRAST:  75mL  OMNIPAQUE IOHEXOL 350 MG/ML SOLN COMPARISON:  Same day chest radiograph FINDINGS: Cardiovascular: Satisfactory opacification of the pulmonary arteries to the segmental level. No evidence of pulmonary embolism. Normal heart size. No pericardial effusion. Mediastinum/Nodes: No enlarged mediastinal, hilar, or axillary lymph nodes. Thyroid gland, trachea, and esophagus demonstrate no significant findings. Lungs/Pleura: There is extensive bilateral heterogeneous and ground-glass nodule airspace opacity, many opacities in Slayden Mennenga subpleural distribution. No pleural effusion or pneumothorax. Upper Abdomen: No acute abnormality. Musculoskeletal: No chest wall abnormality. 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 Review of the MIP images confirms the above findings. IMPRESSION: 1.  Negative examination for pulmonary embolism. 2. There is extensive bilateral heterogeneous and ground-glass nodule airspace opacity, many opacities in Osamah Schmader subpleural distribution, airspace disease generally in keeping with reported diagnosis of COVID-19. Electronically Signed   By: Lauralyn PrimesAlex  Bibbey M.D.   On: 12/22/2018 15:26        Scheduled Meds:  aspirin EC  81 mg Oral Q12H   [START ON 12/24/2018] dexamethasone (DECADRON) injection  6 mg Intravenous Q24H   enoxaparin (LOVENOX) injection  40 mg Subcutaneous Q24H   methylPREDNISolone (SOLU-MEDROL) injection  60 mg Intravenous Q12H   sodium chloride flush  3 mL Intravenous Q12H   Continuous Infusions:  sodium chloride     [START ON 12/24/2018] remdesivir 100 mg in NS 250 mL       LOS: 1 day    Time spent: over 30 min    Lacretia Nicksaldwell Powell, MD [facility] Pager AMION  If 7PM-7AM, please contact night-coverage [email or web address] Password [email or web address] 12/23/2018, 3:43 PM

## 2018-12-23 NOTE — ED Notes (Signed)
[facility] RN Cloyde Reams notified that patient is en route to their facility at this time.

## 2018-12-23 NOTE — Progress Notes (Signed)
Pharmacy Brief Note  O:  ALT: 23 Chest CT: heterogenous opacity SpO2: 91 % on RA  A/P:  Patient meets criteria for remdesevir therapy. Will initiate remdesivir 200 mg iv once followed by 100 mg iv daily x 4 days. Will f/u ALT.   Napoleon Form, [facility] 12/23/18 7:50 AM

## 2018-12-23 NOTE — ED Notes (Signed)
[facility] in ED to transport patient to [facility] at this time. All transport documents provided to Paramedic Hargett.

## 2018-12-23 NOTE — Progress Notes (Signed)
Melatonin 3mg  PO qhs per Dr. Florene Glen.  Onnie Boer, PharmD, BCIDP, AAHIVP, CPP Infectious Disease Pharmacist 12/23/2018 6:09 PM

## 2018-12-23 NOTE — Plan of Care (Signed)
Plan of Care initiated 

## 2018-12-24 LAB — CBC WITH DIFFERENTIAL/PLATELET
Abs Immature Granulocytes: 0.12 10*3/uL — ABNORMAL HIGH (ref 0.00–0.07)
Basophils Absolute: 0 10*3/uL (ref 0.0–0.1)
Basophils Relative: 0 %
Eosinophils Absolute: 0 10*3/uL (ref 0.0–0.5)
Eosinophils Relative: 0 %
HCT: 47.2 % (ref 39.0–52.0)
Hemoglobin: 15.1 g/dL (ref 13.0–17.0)
Immature Granulocytes: 1 %
Lymphocytes Relative: 8 %
Lymphs Abs: 1.6 10*3/uL (ref 0.7–4.0)
MCH: 29.5 pg (ref 26.0–34.0)
MCHC: 32 g/dL (ref 30.0–36.0)
MCV: 92.2 fL (ref 80.0–100.0)
Monocytes Absolute: 1.3 10*3/uL — ABNORMAL HIGH (ref 0.1–1.0)
Monocytes Relative: 7 %
Neutro Abs: 16.4 10*3/uL — ABNORMAL HIGH (ref 1.7–7.7)
Neutrophils Relative %: 84 %
Platelets: 265 10*3/uL (ref 150–400)
RBC: 5.12 MIL/uL (ref 4.22–5.81)
RDW: 12 % (ref 11.5–15.5)
WBC: 19.4 10*3/uL — ABNORMAL HIGH (ref 4.0–10.5)
nRBC: 0 % (ref 0.0–0.2)

## 2018-12-24 LAB — GLUCOSE, CAPILLARY
Glucose-Capillary: 164 mg/dL — ABNORMAL HIGH (ref 70–99)
Glucose-Capillary: 181 mg/dL — ABNORMAL HIGH (ref 70–99)
Glucose-Capillary: 223 mg/dL — ABNORMAL HIGH (ref 70–99)
Glucose-Capillary: 299 mg/dL — ABNORMAL HIGH (ref 70–99)

## 2018-12-24 LAB — C-REACTIVE PROTEIN: CRP: 4.8 mg/dL — ABNORMAL HIGH (ref <1.0)

## 2018-12-24 LAB — COMPREHENSIVE METABOLIC PANEL
ALT: 23 U/L (ref 0–44)
AST: 15 U/L (ref 15–41)
Albumin: 3.7 g/dL (ref 3.5–5.0)
Alkaline Phosphatase: 90 U/L (ref 38–126)
Anion gap: 11 (ref 5–15)
BUN: 17 mg/dL (ref 6–20)
CO2: 26 mmol/L (ref 22–32)
Calcium: 8.8 mg/dL — ABNORMAL LOW (ref 8.9–10.3)
Chloride: 103 mmol/L (ref 98–111)
Creatinine, Ser: 0.56 mg/dL — ABNORMAL LOW (ref 0.61–1.24)
GFR calc Af Amer: >60 mL/min (ref >60)
GFR calc non Af Amer: >60 mL/min (ref >60)
Glucose, Bld: 146 mg/dL — ABNORMAL HIGH (ref 70–99)
Potassium: 4.5 mmol/L (ref 3.5–5.1)
Sodium: 140 mmol/L (ref 135–145)
Total Bilirubin: 0.7 mg/dL (ref 0.3–1.2)
Total Protein: 7.1 g/dL (ref 6.5–8.1)

## 2018-12-24 LAB — MAGNESIUM: Magnesium: 2.1 mg/dL (ref 1.7–2.4)

## 2018-12-24 LAB — FERRITIN: Ferritin: 207 ng/mL (ref 24–336)

## 2018-12-24 LAB — TSH: TSH: 0.113 u[IU]/mL — ABNORMAL LOW (ref 0.350–4.500)

## 2018-12-24 LAB — T4, FREE: Free T4: 1.04 ng/dL (ref 0.61–1.12)

## 2018-12-24 LAB — D-DIMER, QUANTITATIVE: D-Dimer, Quant: 0.52 ug/mL-FEU — ABNORMAL HIGH (ref 0.00–0.50)

## 2018-12-24 MED ORDER — POLYETHYLENE GLYCOL 3350 17 G PO PACK
17.0000 g | PACK | Freq: Two times a day (BID) | ORAL | Status: DC
  Administered 2018-12-24 – 2018-12-27 (×5): 17 g via ORAL
  Filled 2018-12-24 (×5): qty 1

## 2018-12-24 MED ORDER — LACTATED RINGERS IV BOLUS
500.0000 mL | Freq: Once | INTRAVENOUS | Status: AC
  Administered 2018-12-24: 500 mL via INTRAVENOUS

## 2018-12-24 MED ORDER — GUAIFENESIN-DM 100-10 MG/5ML PO SYRP
5.0000 mL | ORAL_SOLUTION | ORAL | Status: DC | PRN
  Administered 2018-12-24 – 2018-12-25 (×6): 5 mL via ORAL
  Filled 2018-12-24 (×6): qty 10

## 2018-12-24 MED ORDER — DM-GUAIFENESIN ER 30-600 MG PO TB12: 1.0000 | ORAL_TABLET | Freq: Two times a day (BID) | ORAL | Status: DC

## 2018-12-24 NOTE — Progress Notes (Addendum)
PROGRESS NOTE    Jeffery Lloyd  WUJ:[id number] DOB: [birth year] DOA: 12/22/2018 PCP: Patient, No Pcp Per   Brief Narrative:  Jeffery Lloyd  is a [age] y.o. male, with past medical history significant for arthritis and sacral agenesis and recent diagnosis of COVID-19 on Monday, sent from PMDs office for evaluation for hypoxemia, noted today.  Chest x-ray at his PCPs office showed worsening bilateral lung abnormalities from a previous chest x-ray.  Patient reports left-sided pleuritic chest pain with shortness of breath.  No nausea vomiting or diarrhea reported.  Symptoms improved on nasal cannula BC and BMP were within normal, chest CT was negative for pulmonary embolism with extensive bilateral heterogeneous and groundglass nodule airspace opacities suggestive of COVID-19 .  Patient was noted to be tachypneic and tachycardic in the ER  He was admitted with COVID 19 Pneumonia.  Currently being treated with steroids and remdesivir.  Discharge pending further improvement.  Assessment & Plan:   Active Problems:   Pneumonia due to COVID-19 virus   COVID-19 pneumonia with new oxygen requirement Currently on RA, satting in low 90's Steroids/remdesivir - day 2 I/O, daily weights Daily labs - inflammatory markers improving today Vitamin C/Zinc  CT PE protocol with bilateral heterogenous and ground glass nodule airspace opacity, many opacities in a subpleural distribution - 12/22/2018  COVID-19 Labs  Recent Labs    12/22/18 1732 12/23/18 0600 12/24/18 0311  DDIMER 0.56* 0.66* 0.52*  FERRITIN 251 256 207  LDH 159  --   --   CRP 7.7* 8.3* 4.8*    Lab Results  Component Value Date   SARSCOV2NAA POSITIVE (A) 12/22/2018   Sinus Tachycardia: follow with small bolus.  Tachy with minimal activity.  CT PE protocol at presentation was negative for PE.  Troponin at presentation negative. Follow TSH  Arthritis Continue with pain control  Hyperglycemia  Prediabetes Start on carb modified diet  SSI  Hx Sacral Agenesis  Anxiety: continue klonopin   DVT prophylaxis: lovenox Code Status: full Family Communication: called wife, no answer 11/10 Disposition Plan: pending  Consultants:   none  Procedures:  none  Antimicrobials: Anti-infectives (From admission, onward)   Start     Dose/Rate Route Frequency Ordered Stop   12/24/18 1000  remdesivir 100 mg in sodium chloride 0.9 % 250 mL IVPB     100 mg 500 mL/hr over 30 Minutes Intravenous Every 24 hours 12/23/18 0749 12/28/18 0959   12/23/18 1000  remdesivir 200 mg in sodium chloride 0.9 % 250 mL IVPB     200 mg 500 mL/hr over 30 Minutes Intravenous Once 12/23/18 0749 12/23/18 1013         Subjective: Feeling at little better today Palpitiations from tachy  Objective: Vitals:   12/24/18 0200 12/24/18 0300 12/24/18 0500 12/24/18 0731  BP:   118/60 110/64  Pulse: 87 80  97  Resp: 20 (!) 21  20  Temp:   98.6 F (37 C) 98.7 F (37.1 C)  TempSrc:   Oral   SpO2: 92% 95% 93% 92%  Weight:      Height:        Intake/Output Summary (Last 24 hours) at 12/24/2018 1439 Last data filed at 12/24/2018 0500 Gross per 24 hour  Intake 1220 ml  Output 500 ml  Net 720 ml   Filed Weights   12/23/18 0600  Weight: 111.9 kg    Examination:  General: No acute distress. Cardiovascular: tachycardic Lungs: unlabored Abdomen: Soft, nontender, nondistended Neurological: Alert and oriented 3. Moves  all extremities 4. Cranial nerves II through XII grossly intact. Skin: Warm and dry. No rashes or lesions. Extremities: No clubbing or cyanosis. No edema.   Data Reviewed: I have personally reviewed following labs and imaging studies  CBC: Recent Labs  Lab 12/22/18 1406 12/23/18 0553 12/24/18 0311  WBC 8.1 10.6* 19.4*  NEUTROABS 5.9 9.2* 16.4*  HGB 15.4 15.7 15.1  HCT 46.6 48.6 47.2  MCV 90.1 90.2 92.2  PLT 168 232 265   Basic Metabolic Panel: Recent Labs  Lab 12/22/18 1406 12/23/18 0553 12/24/18 0311  NA  138 138 140  K 4.2 4.2 4.5  CL 106 102 103  CO2 23 24 26   GLUCOSE 169* 171* 146*  BUN 8 14 17   CREATININE 0.74 0.68 0.56*  CALCIUM 9.1 9.3 8.8*  MG  --   --  2.1   GFR: Estimated Creatinine Clearance: 136.3 mL/min (A) (by C-G formula based on SCr of 0.56 mg/dL (L)). Liver Function Tests: Recent Labs  Lab 12/22/18 1406 12/24/18 0311  AST 21 15  ALT 23 23  ALKPHOS 86 90  BILITOT 0.8 0.7  PROT 6.8 7.1  ALBUMIN 3.7 3.7   Recent Labs  Lab 12/22/18 1406  LIPASE 35   No results for input(s): AMMONIA in the last 168 hours. Coagulation Profile: No results for input(s): INR, PROTIME in the last 168 hours. Cardiac Enzymes: No results for input(s): CKTOTAL, CKMB, CKMBINDEX, TROPONINI in the last 168 hours. BNP (last 3 results) No results for input(s): PROBNP in the last 8760 hours. HbA1C: Recent Labs    12/23/18 0600  HGBA1C 5.9*   CBG: Recent Labs  Lab 12/23/18 1731 12/23/18 2101 12/24/18 0733 12/24/18 1156  GLUCAP 237* 238* 164* 181*   Lipid Profile: Recent Labs    12/22/18 1732  TRIG 205*   Thyroid Function Tests: No results for input(s): TSH, T4TOTAL, FREET4, T3FREE, THYROIDAB in the last 72 hours. Anemia Panel: Recent Labs    12/23/18 0600 12/24/18 0311  FERRITIN 256 207   Sepsis Labs: Recent Labs  Lab 12/22/18 1508 12/22/18 1732  PROCALCITON  --  <0.10  LATICACIDVEN 1.4 1.4    Recent Results (from the past 240 hour(s))  Blood culture (routine x 2)     Status: None (Preliminary result)   Collection Time: 12/22/18  3:08 PM   Specimen: BLOOD  Result Value Ref Range Status   Specimen Description BLOOD RIGHT ANTECUBITAL  Final   Special Requests   Final    BOTTLES DRAWN AEROBIC AND ANAEROBIC Blood Culture adequate volume   Culture   Final    NO GROWTH 2 DAYS Performed at [facility], [address], [place], [address] [place]    Report Status PENDING  Incomplete  Blood culture (routine x 2)     Status: None (Preliminary result)    Collection Time: 12/22/18  3:08 PM   Specimen: BLOOD RIGHT WRIST  Result Value Ref Range Status   Specimen Description BLOOD RIGHT WRIST  Final   Special Requests   Final    BOTTLES DRAWN AEROBIC AND ANAEROBIC Blood Culture adequate volume   Culture   Final    NO GROWTH 2 DAYS Performed at [facility], [address], [place], [address] [place]    Report Status PENDING  Incomplete  SARS CORONAVIRUS 2 (TAT 6-24 HRS) Nasopharyngeal Nasopharyngeal Swab     Status: Abnormal   Collection Time: 12/22/18  5:25 PM   Specimen: Nasopharyngeal Swab  Result Value Ref  Range Status   SARS Coronavirus 2 POSITIVE (A) NEGATIVE Final    Comment: RESULT CALLED TO, READ BACK BY AND VERIFIED WITH: MORRIS T, RN AT 2211 ON 12/22/2018 BY SAINVILUS S (NOTE) SARS-CoV-2 target nucleic acids are DETECTED. The SARS-CoV-2 RNA is generally detectable in upper and lower respiratory specimens during the acute phase of infection. Positive results are indicative of active infection with SARS-CoV-2. Clinical  correlation with patient history and other diagnostic information is necessary to determine patient infection status. Positive results do  not rule out bacterial infection or co-infection with other viruses. The expected result is Negative. Fact Sheet for Patients: SugarRoll.be Fact Sheet for Healthcare Providers: https://www.woods-mathews.com/ This test is not yet approved or cleared by the [place] FDA and  has been authorized for detection and/or diagnosis of SARS-CoV-2 by FDA under an Emergency Use Authorization (EUA). This EUA will remain  in effect (meaning this test can  be used) for the duration of the COVID-19 declaration under Section 564(b)(1) of the Act, 21 U.S.C. section 360bbb-3(b)(1), unless the authorization is terminated or revoked sooner. Performed at [facility], [place] [address], [place],  [address]           Radiology Studies: Ct Angio Chest Pe W And/or Wo Contrast  Result Date: 12/22/2018 CLINICAL DATA:  Shortness of breath, COVID-19 positive, abnormal chest x-ray EXAM: CT ANGIOGRAPHY CHEST WITH CONTRAST TECHNIQUE: Multidetector CT imaging of the chest was performed using the standard protocol during bolus administration of intravenous contrast. Multiplanar CT image reconstructions and MIPs were obtained to evaluate the vascular anatomy. CONTRAST:  11mL OMNIPAQUE IOHEXOL 350 MG/ML SOLN COMPARISON:  Same day chest radiograph FINDINGS: Cardiovascular: Satisfactory opacification of the pulmonary arteries to the segmental level. No evidence of pulmonary embolism. Normal heart size. No pericardial effusion. Mediastinum/Nodes: No enlarged mediastinal, hilar, or axillary lymph nodes. Thyroid gland, trachea, and esophagus demonstrate no significant findings. Lungs/Pleura: There is extensive bilateral heterogeneous and ground-glass nodule airspace opacity, many opacities in a subpleural distribution. No pleural effusion or pneumothorax. Upper Abdomen: No acute abnormality. Musculoskeletal: No chest wall abnormality. 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 Review of the MIP images confirms the above findings. IMPRESSION: 1.  Negative examination for pulmonary embolism. 2. There is extensive bilateral heterogeneous and ground-glass nodule airspace opacity, many opacities in a subpleural distribution, airspace disease generally in keeping with reported diagnosis of COVID-19. Electronically Signed   By: Eddie Candle M.D.   On: 12/22/2018 15:26        Scheduled Meds: . aspirin EC  81 mg Oral Q12H  . dexamethasone (DECADRON) injection  6 mg Intravenous Q24H  . enoxaparin (LOVENOX) injection  40 mg Subcutaneous Q24H  . insulin aspart  0-5 Units Subcutaneous QHS  . insulin aspart  0-9 Units Subcutaneous TID WC  . Melatonin  3 mg Oral QHS  . polyethylene glycol  17 g Oral BID  . sodium chloride  flush  3 mL Intravenous Q12H  . vitamin C  1,000 mg Oral Daily  . zinc sulfate  220 mg Oral Daily   Continuous Infusions: . sodium chloride    . remdesivir 100 mg in NS 250 mL 100 mg (12/24/18 0924)     LOS: 2 days    Time spent: over 42 min    Fayrene Helper, MD [facility] Pager AMION  If 7PM-7AM, please contact night-coverage [email or web address] Password [facility] 12/24/2018, 2:39 PM

## 2018-12-25 LAB — COMPREHENSIVE METABOLIC PANEL
ALT: 21 U/L (ref 0–44)
AST: 12 U/L — ABNORMAL LOW (ref 15–41)
Albumin: 3.7 g/dL (ref 3.5–5.0)
Alkaline Phosphatase: 88 U/L (ref 38–126)
Anion gap: 11 (ref 5–15)
BUN: 21 mg/dL — ABNORMAL HIGH (ref 6–20)
CO2: 26 mmol/L (ref 22–32)
Calcium: 8.6 mg/dL — ABNORMAL LOW (ref 8.9–10.3)
Chloride: 103 mmol/L (ref 98–111)
Creatinine, Ser: 0.67 mg/dL (ref 0.61–1.24)
GFR calc Af Amer: >60 mL/min (ref >60)
GFR calc non Af Amer: >60 mL/min (ref >60)
Glucose, Bld: 145 mg/dL — ABNORMAL HIGH (ref 70–99)
Potassium: 4 mmol/L (ref 3.5–5.1)
Sodium: 140 mmol/L (ref 135–145)
Total Bilirubin: 0.7 mg/dL (ref 0.3–1.2)
Total Protein: 6.9 g/dL (ref 6.5–8.1)

## 2018-12-25 LAB — CBC WITH DIFFERENTIAL/PLATELET
Abs Immature Granulocytes: 0.16 10*3/uL — ABNORMAL HIGH (ref 0.00–0.07)
Basophils Absolute: 0 10*3/uL (ref 0.0–0.1)
Basophils Relative: 0 %
Eosinophils Absolute: 0 10*3/uL (ref 0.0–0.5)
Eosinophils Relative: 0 %
HCT: 46.5 % (ref 39.0–52.0)
Hemoglobin: 14.8 g/dL (ref 13.0–17.0)
Immature Granulocytes: 1 %
Lymphocytes Relative: 15 %
Lymphs Abs: 2.3 10*3/uL (ref 0.7–4.0)
MCH: 29.7 pg (ref 26.0–34.0)
MCHC: 31.8 g/dL (ref 30.0–36.0)
MCV: 93.4 fL (ref 80.0–100.0)
Monocytes Absolute: 1.4 10*3/uL — ABNORMAL HIGH (ref 0.1–1.0)
Monocytes Relative: 9 %
Neutro Abs: 11.1 10*3/uL — ABNORMAL HIGH (ref 1.7–7.7)
Neutrophils Relative %: 75 %
Platelets: 245 10*3/uL (ref 150–400)
RBC: 4.98 MIL/uL (ref 4.22–5.81)
RDW: 12.2 % (ref 11.5–15.5)
WBC: 15 10*3/uL — ABNORMAL HIGH (ref 4.0–10.5)
nRBC: 0 % (ref 0.0–0.2)

## 2018-12-25 LAB — C-REACTIVE PROTEIN: CRP: 3.5 mg/dL — ABNORMAL HIGH (ref <1.0)

## 2018-12-25 LAB — GLUCOSE, CAPILLARY
Glucose-Capillary: 124 mg/dL — ABNORMAL HIGH (ref 70–99)
Glucose-Capillary: 182 mg/dL — ABNORMAL HIGH (ref 70–99)
Glucose-Capillary: 245 mg/dL — ABNORMAL HIGH (ref 70–99)
Glucose-Capillary: 248 mg/dL — ABNORMAL HIGH (ref 70–99)

## 2018-12-25 LAB — MAGNESIUM: Magnesium: 2.3 mg/dL (ref 1.7–2.4)

## 2018-12-25 LAB — D-DIMER, QUANTITATIVE: D-Dimer, Quant: 0.47 ug/mL-FEU (ref 0.00–0.50)

## 2018-12-25 LAB — FERRITIN: Ferritin: 212 ng/mL (ref 24–336)

## 2018-12-25 MED ORDER — ENOXAPARIN SODIUM 60 MG/0.6ML ~~LOC~~ SOLN
0.5000 mg/kg | SUBCUTANEOUS | Status: DC
  Administered 2018-12-25 – 2018-12-26 (×2): 55 mg via SUBCUTANEOUS
  Filled 2018-12-25 (×2): qty 0.6

## 2018-12-25 NOTE — Plan of Care (Signed)
  Problem: Education: Goal: Knowledge of risk factors and measures for prevention of condition will improve Outcome: Progressing   Problem: Coping: Goal: Psychosocial and spiritual needs will be supported Outcome: Progressing   Problem: Respiratory: Goal: Will maintain a patent airway Outcome: Progressing Goal: Complications related to the disease process, condition or treatment will be avoided or minimized Outcome: Progressing   

## 2018-12-25 NOTE — Progress Notes (Addendum)
PROGRESS NOTE  Jeffery Lloyd  LZJ:[id number] DOB: Apr 16, 1970 DOA: 12/22/2018 PCP: Patient, No Pcp Per   Brief Narrative: [name] a48 y.o.male,with past medical history significant for arthritis and sacral agenesis and recent diagnosis of COVID-19 on Monday, sent from [place] office for evaluation for hypoxemia, noted today. Chest x-ray at his PCPs office showed worsening bilateral lung abnormalities from a previous chest x-ray. Patient reports left-sided pleuritic chest pain with shortness of breath. No nausea vomiting or diarrhea reported. Symptoms improved on nasal cannula BC and BMP were within normal, chest CT was negative for pulmonary embolism with extensive bilateral heterogeneous and groundglass nodule airspace opacities suggestive of COVID-19.Patient was noted to be tachypneic and tachycardic in the ER  He was admitted with COVID 19 Pneumonia.  Currently being treated with steroids and remdesivir.  Discharge pending further improvement.  Assessment & Plan: Active Problems:   Pneumonia due to COVID-19 virus  Acute hypoxic respiratory failure due to covid-19 pneumonia:  - Continue to wean oxygen as able, OOB, IS, prone as able - Continue remdesivir 11/9 - 11/13 - Continue steroids, trend inflammatory markers - Continue precautions - Vitamin C, zinc  Sinus tachycardia: Negative PE study, TSH low but T4 normal. Likely reactive/appropriate to infection.  - Consider outpatient cardiac monitoring.   Obesity: BMI 38. Noted  Anxiety:  - Continue clonazepam  Prediabetes with steroid-induced hyperglycemia: HbA1c 5.9%.  - continue SSI  History of sacral agenesis.   DVT prophylaxis: Lovenox Code Status: Full Family Communication: None at bedside Disposition Plan: Home once remdesivir completed  Consultants:   None  Procedures:   None  Antimicrobials:  Remdesivir 11/9 - 11/13   Subjective: Breathing is better at rest, still short of breath moderately,  constantly when exerting himself. No chest pain, does have intermittent palpitations for a while now.   Objective: Vitals:   12/24/18 2346 12/25/18 0400 12/25/18 0739 12/25/18 1612  BP: (!) 127/94 115/62 125/73 126/71  Pulse: 94 77 86 87  Resp: 17 (!) 23 (!) 23 20  Temp: 97.8 F (36.6 C) 97.6 F (36.4 C) 99.3 F (37.4 C) 98.8 F (37.1 C)  TempSrc:  Oral Oral Oral  SpO2: 92% 91% (!) 88% 91%  Weight:      Height:        Intake/Output Summary (Last 24 hours) at 12/25/2018 1719 Last data filed at 12/25/2018 0000 Gross per 24 hour  Intake 480 ml  Output -  Net 480 ml   Filed Weights   12/23/18 0600  Weight: 111.9 kg    Gen: 48 y.o. male in no distress  Pulm: Non-labored breathing room air at rest. Clear to auscultation bilaterally.  CV: Regular rate and rhythm. No murmur, rub, or gallop. No JVD, no pedal edema. GI: Abdomen soft, non-tender, non-distended, with normoactive bowel sounds. No organomegaly or masses felt. Ext: Warm, no deformities Skin: No rashes, lesions or ulcers Neuro: Alert and oriented. No focal neurological deficits. Psych: Judgement and insight appear normal. Mood & affect appropriate.   Data Reviewed: I have personally reviewed following labs and imaging studies  CBC: Recent Labs  Lab 12/22/18 1406 12/23/18 0553 12/24/18 0311 12/25/18 0125  WBC 8.1 10.6* 19.4* 15.0*  NEUTROABS 5.9 9.2* 16.4* 11.1*  HGB 15.4 15.7 15.1 14.8  HCT 46.6 48.6 47.2 46.5  MCV 90.1 90.2 92.2 93.4  PLT 168 232 265 024   Basic Metabolic Panel: Recent Labs  Lab 12/22/18 1406 12/23/18 0553 12/24/18 0311 12/25/18 0125  NA 138 138 140 140  K  4.2 4.2 4.5 4.0  CL 106 102 103 103  CO2 23 24 26 26   GLUCOSE 169* 171* 146* 145*  BUN 8 14 17  21*  CREATININE 0.74 0.68 0.56* 0.67  CALCIUM 9.1 9.3 8.8* 8.6*  MG  --   --  2.1 2.3   GFR: Estimated Creatinine Clearance: 136.3 mL/min (by C-G formula based on SCr of 0.67 mg/dL). Liver Function Tests: Recent Labs  Lab  12/22/18 1406 12/24/18 0311 12/25/18 0125  AST 21 15 12*  ALT 23 23 21   ALKPHOS 86 90 88  BILITOT 0.8 0.7 0.7  PROT 6.8 7.1 6.9  ALBUMIN 3.7 3.7 3.7   Recent Labs  Lab 12/22/18 1406  LIPASE 35   No results for input(s): AMMONIA in the last 168 hours. Coagulation Profile: No results for input(s): INR, PROTIME in the last 168 hours. Cardiac Enzymes: No results for input(s): CKTOTAL, CKMB, CKMBINDEX, TROPONINI in the last 168 hours. BNP (last 3 results) No results for input(s): PROBNP in the last 8760 hours. HbA1C: Recent Labs    12/23/18 0600  HGBA1C 5.9*   CBG: Recent Labs  Lab 12/24/18 1526 12/24/18 2003 12/25/18 0738 12/25/18 1114 12/25/18 1614  GLUCAP 223* 299* 124* 182* 245*   Lipid Profile: Recent Labs    12/22/18 1732  TRIG 205*   Thyroid Function Tests: Recent Labs    12/24/18 0311  TSH 0.113*  FREET4 1.04   Anemia Panel: Recent Labs    12/24/18 0311 12/25/18 0125  FERRITIN 207 212   Urine analysis: No results found for: COLORURINE, APPEARANCEUR, LABSPEC, PHURINE, GLUCOSEU, HGBUR, BILIRUBINUR, KETONESUR, PROTEINUR, UROBILINOGEN, NITRITE, LEUKOCYTESUR Recent Results (from the past 240 hour(s))  Blood culture (routine x 2)     Status: None (Preliminary result)   Collection Time: 12/22/18  3:08 PM   Specimen: BLOOD  Result Value Ref Range Status   Specimen Description BLOOD RIGHT ANTECUBITAL  Final   Special Requests   Final    BOTTLES DRAWN AEROBIC AND ANAEROBIC Blood Culture adequate volume   Culture   Final    NO GROWTH 3 DAYS Performed at [facility], [address], [place], [facility] [address]    Report Status PENDING  Incomplete  Blood culture (routine x 2)     Status: None (Preliminary result)   Collection Time: 12/22/18  3:08 PM   Specimen: BLOOD RIGHT WRIST  Result Value Ref Range Status   Specimen Description BLOOD RIGHT WRIST  Final   Special Requests   Final    BOTTLES DRAWN AEROBIC AND ANAEROBIC Blood Culture  adequate volume   Culture   Final    NO GROWTH 3 DAYS Performed at [facility], [address], [place], [facility] [address]    Report Status PENDING  Incomplete  SARS CORONAVIRUS 2 (TAT 6-24 HRS) Nasopharyngeal Nasopharyngeal Swab     Status: Abnormal   Collection Time: 12/22/18  5:25 PM   Specimen: Nasopharyngeal Swab  Result Value Ref Range Status   SARS Coronavirus 2 POSITIVE (A) NEGATIVE Final    Comment: RESULT CALLED TO, READ BACK BY AND VERIFIED WITH: MORRIS T, RN AT 2211 ON 12/22/2018 BY SAINVILUS S (NOTE) SARS-CoV-2 target nucleic acids are DETECTED. The SARS-CoV-2 RNA is generally detectable in upper and lower respiratory specimens during the acute phase of infection. Positive results are indicative of active infection with SARS-CoV-2. Clinical  correlation with patient history and other diagnostic information is necessary to determine patient infection status. Positive results do  not rule out bacterial infection or co-infection with other viruses. The expected result is Negative. Fact Sheet for Patients: HairSlick.nohttps://www.fda.gov/media/138098/download Fact Sheet for Healthcare Providers: quierodirigir.comhttps://www.fda.gov/media/138095/download This test is not yet approved or cleared by the [place] FDA and  has been authorized for detection and/or diagnosis of SARS-CoV-2 by FDA under an Emergency Use Authorization (EUA). This EUA will remain  in effect (meaning this test can  be used) for the duration of the COVID-19 declaration under Section 564(b)(1) of the Act, 21 U.S.C. section 360bbb-3(b)(1), unless the authorization is terminated or revoked sooner. Performed at [facility], [address] [place], [place] [address]       Radiology Studies: No results found.  Scheduled Meds: . aspirin EC  81 mg Oral Q12H  . dexamethasone (DECADRON) injection  6 mg Intravenous Q24H  . enoxaparin (LOVENOX) injection  0.5 mg/kg Subcutaneous Q24H  . insulin aspart  0-5 Units  Subcutaneous QHS  . insulin aspart  0-9 Units Subcutaneous TID WC  . Melatonin  3 mg Oral QHS  . polyethylene glycol  17 g Oral BID  . sodium chloride flush  3 mL Intravenous Q12H  . vitamin C  1,000 mg Oral Daily  . zinc sulfate  220 mg Oral Daily   Continuous Infusions: . sodium chloride    . remdesivir 100 mg in NS 250 mL 100 mg (12/25/18 1029)     LOS: 3 days   Time spent: 25 minutes.  Tyrone Nineyan B Janessa Mickle, MD [facility] [email or web address] 12/25/2018, 5:19 PM

## 2018-12-25 NOTE — Progress Notes (Signed)
Called pt's  wife for update. But no answered

## 2018-12-26 ENCOUNTER — Encounter (HOSPITAL_COMMUNITY): Payer: Self-pay | Admitting: Family Medicine

## 2018-12-26 DIAGNOSIS — J9601 Acute respiratory failure with hypoxia: Secondary | ICD-10-CM | POA: Diagnosis present

## 2018-12-26 DIAGNOSIS — T380X5A Adverse effect of glucocorticoids and synthetic analogues, initial encounter: Secondary | ICD-10-CM | POA: Diagnosis not present

## 2018-12-26 DIAGNOSIS — G4730 Sleep apnea, unspecified: Secondary | ICD-10-CM | POA: Diagnosis present

## 2018-12-26 DIAGNOSIS — F419 Anxiety disorder, unspecified: Secondary | ICD-10-CM | POA: Diagnosis present

## 2018-12-26 DIAGNOSIS — R739 Hyperglycemia, unspecified: Secondary | ICD-10-CM | POA: Diagnosis not present

## 2018-12-26 DIAGNOSIS — R7303 Prediabetes: Secondary | ICD-10-CM | POA: Diagnosis present

## 2018-12-26 DIAGNOSIS — Q7649 Other congenital malformations of spine, not associated with scoliosis: Secondary | ICD-10-CM | POA: Insufficient documentation

## 2018-12-26 LAB — COMPREHENSIVE METABOLIC PANEL
ALT: 19 U/L (ref 0–44)
AST: 16 U/L (ref 15–41)
Albumin: 3.5 g/dL (ref 3.5–5.0)
Alkaline Phosphatase: 88 U/L (ref 38–126)
Anion gap: 11 (ref 5–15)
BUN: 18 mg/dL (ref 6–20)
CO2: 27 mmol/L (ref 22–32)
Calcium: 8.2 mg/dL — ABNORMAL LOW (ref 8.9–10.3)
Chloride: 100 mmol/L (ref 98–111)
Creatinine, Ser: 0.56 mg/dL — ABNORMAL LOW (ref 0.61–1.24)
GFR calc Af Amer: >60 mL/min (ref >60)
GFR calc non Af Amer: >60 mL/min (ref >60)
Glucose, Bld: 132 mg/dL — ABNORMAL HIGH (ref 70–99)
Potassium: 4.3 mmol/L (ref 3.5–5.1)
Sodium: 138 mmol/L (ref 135–145)
Total Bilirubin: 0.7 mg/dL (ref 0.3–1.2)
Total Protein: 6.8 g/dL (ref 6.5–8.1)

## 2018-12-26 LAB — GLUCOSE, CAPILLARY
Glucose-Capillary: 122 mg/dL — ABNORMAL HIGH (ref 70–99)
Glucose-Capillary: 202 mg/dL — ABNORMAL HIGH (ref 70–99)
Glucose-Capillary: 215 mg/dL — ABNORMAL HIGH (ref 70–99)
Glucose-Capillary: 244 mg/dL — ABNORMAL HIGH (ref 70–99)

## 2018-12-26 LAB — C-REACTIVE PROTEIN: CRP: 3 mg/dL — ABNORMAL HIGH (ref <1.0)

## 2018-12-26 MED ORDER — INSULIN ASPART 100 UNIT/ML ~~LOC~~ SOLN
0.0000 [IU] | Freq: Three times a day (TID) | SUBCUTANEOUS | Status: DC
  Administered 2018-12-26 (×2): 7 [IU] via SUBCUTANEOUS

## 2018-12-26 MED ORDER — INFLUENZA VAC SPLIT QUAD 0.5 ML IM SUSY
0.5000 mL | PREFILLED_SYRINGE | INTRAMUSCULAR | Status: AC
  Administered 2018-12-27: 0.5 mL via INTRAMUSCULAR
  Filled 2018-12-26: qty 0.5

## 2018-12-26 MED ORDER — INSULIN ASPART 100 UNIT/ML ~~LOC~~ SOLN
0.0000 [IU] | Freq: Every day | SUBCUTANEOUS | Status: DC
  Administered 2018-12-26: 2 [IU] via SUBCUTANEOUS

## 2018-12-26 NOTE — Progress Notes (Addendum)
SATURATION QUALIFICATIONS: (This note is used to comply with regulatory documentation for home oxygen)  Patient Saturations on Room Air at Rest = 93  Patient Saturations on Room Air while Ambulating = 92  Patient Saturations on  RA of oxygen while Ambulating = %92  Please briefly explain why patient needs home oxygen: Pt only wearing O2 at night because he is supposed to be wearing his CPAP but is not able to at this time.

## 2018-12-26 NOTE — Progress Notes (Signed)
Called spouse updated her of pt's condition. Jeffery Lloyd has Covid, as well the children. She worried about her and the children. Encouraged the spouse to seek medical attention from her PCP.

## 2018-12-26 NOTE — Evaluation (Signed)
Physical Therapy Evaluation & Discharge Patient Details Name: Jeffery Lloyd MRN: [id number] DOB: 01-Jun-1970 Today's Date: 12/26/2018   History of Present Illness  Pt is a 48 y.o. male admitted 12/22/18 with worsening hypoxemia and COVID-19. Chest CT negative for PE. PMH includes obesity, anxiety, sacral agenesis.    Clinical Impression  Patient evaluated by Physical Therapy with no further acute PT needs identified. PTA, pt independent, works and lives with family. Today, pt indep with mobility and ADLs. SpO2 >/88% on RA with ambulation. Educ re: energy conservation strategies, return to activity recommendations. All education has been completed and the patient has no further questions. Acute PT is signing off. Thank you for this referral.    Follow Up Recommendations No PT follow up    Equipment Recommendations  None recommended by PT    Recommendations for Other Services       Precautions / Restrictions Precautions Precautions: None Restrictions Weight Bearing Restrictions: No      Mobility  Bed Mobility Overal bed mobility: Independent                Transfers Overall transfer level: Independent Equipment used: None                Ambulation/Gait Ambulation/Gait assistance: Independent Social research officer, government (Feet): 600 Feet Assistive device: None;IV Pole Gait Pattern/deviations: Step-through pattern;Decreased stride length   Gait velocity interpretation: >2.62 ft/sec, indicative of community ambulatory General Gait Details: SpO2 88-92% while ambulating, pt wearing face mask and talking  Stairs            Wheelchair Mobility    Modified Rankin (Stroke Patients Only)       Balance Overall balance assessment: No apparent balance deficits (not formally assessed)                                           Pertinent Vitals/Pain Pain Assessment: Faces Faces Pain Scale: Hurts a little bit Pain Location: Lower back Pain Descriptors /  Indicators: Discomfort Pain Intervention(s): Monitored during session    Home Living Family/patient expects to be discharged to:: Private residence Living Arrangements: Spouse/significant other;Children Available Help at Discharge: Family;Available 24 hours/day Type of Home: House Home Access: Level entry     Home Layout: One level Home Equipment: None      Prior Function Level of Independence: Independent         Comments: Works in Personal assistant, 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     Journalist, newspaper        Extremity/Trunk Assessment   Upper Extremity Assessment Upper Extremity Assessment: Overall WFL for tasks assessed    Lower Extremity Assessment Lower Extremity Assessment: (h/o sacral agenesis with buttocks and lower leg deformity; strength WFL)       Communication   Communication: No difficulties  Cognition Arousal/Alertness: Awake/alert Behavior During Therapy: WFL for tasks assessed/performed Overall Cognitive Status: Within Functional Limits for tasks assessed                                        General Comments General comments (skin integrity, edema, etc.): educ re: energy conservation, return to activity recommendations, deep/pursed lip breathing    Exercises     Assessment/Plan    PT Assessment Patent does not need any further PT services  PT  Problem List         PT Treatment Interventions      PT Goals (Current goals can be found in the Care Plan section)  Acute Rehab PT Goals PT Goal Formulation: All assessment and education complete, DC therapy    Frequency     Barriers to discharge        Co-evaluation               AM-PAC PT "6 Clicks" Mobility  Outcome Measure Help needed turning from your back to your side while in a flat bed without using bedrails?: None Help needed moving from lying on your back to sitting on the side of a flat bed without using bedrails?: None Help needed moving to and from a  bed to a chair (including a wheelchair)?: None Help needed standing up from a chair using your arms (e.g., wheelchair or bedside chair)?: None Help needed to walk in hospital room?: None Help needed climbing 3-5 steps with a railing? : None 6 Click Score: 24    End of Session   Activity Tolerance: Patient tolerated treatment well Patient left: in bed;with call bell/phone within reach Nurse Communication: Mobility status PT Visit Diagnosis: Other abnormalities of gait and mobility (R26.89)    Time: 7169-6789 PT Time Calculation (min) (ACUTE ONLY): 21 min   Charges:   PT Evaluation $PT Eval Low Complexity: 1 Low     Ina Homes, PT, DPT Acute Rehabilitation Services  Pager [phone] Office [phone]  Malachy Chamber 12/26/2018, 1:17 PM

## 2018-12-26 NOTE — Progress Notes (Signed)
SATURATION QUALIFICATIONS: (This note is used to comply with regulatory documentation for home oxygen)  Patient Saturations on Room Air at Rest = 94%  Patient Saturations on Room Air while Ambulating = 90%  Please briefly explain why patient needs home oxygen: Pt tolerated ambulation fair.  He did briefly drop to 88%, but coughed and went back up to 90 immediately.  Returned to chair and he briefly dropped to 88% again, but returned to 92% on RA.

## 2018-12-26 NOTE — Progress Notes (Signed)
Updated wife with POC.

## 2018-12-26 NOTE — Progress Notes (Signed)
PROGRESS NOTE  Jahmeek Shirk  [id number]:[id number] DOB: Mar 06, 1970 DOA: 12/22/2018 PCP: Patient, No Pcp Per   Brief Narrative: [name] a48 y.o.male,with past medical history significant for arthritis and sacral agenesis and recent diagnosis of COVID-19 on Monday, sent from [facility] office for evaluation for hypoxemia, noted today. Chest x-ray at his PCPs office showed worsening bilateral lung abnormalities from a previous chest x-ray. Patient reports left-sided pleuritic chest pain with shortness of breath. No nausea vomiting or diarrhea reported. Symptoms improved on nasal cannula BC and BMP were within normal, chest CT was negative for pulmonary embolism with extensive bilateral heterogeneous and groundglass nodule airspace opacities suggestive of COVID-19.Patient was noted to be tachypneic and tachycardic in the ER  He was admitted with COVID 19 Pneumonia.  Currently being treated with steroids and remdesivir.  Discharge pending further improvement.  Assessment & Plan: Active Problems:   Pneumonia due to COVID-19 virus  Acute hypoxic respiratory failure due to covid-19 pneumonia:  - Continue to wean oxygen as able, OOB, IS, prone as able - Continue remdesivir 11/9 - 11/13. ALT remains wnl. - Continue steroids, trend inflammatory markers which are improving - Continue precautions - Vitamin C, zinc  History of sleep apnea:  - Ok to give supplemental oxygen qHS in lieu of NIPPV while admitted with covid.  Sinus tachycardia: Negative PE study, TSH low but T4 normal. Likely reactive/appropriate to infection. Improving overall. - Consider outpatient cardiac monitoring.   Obesity: BMI 38. Noted  Anxiety:  - Continue clonazepam  Prediabetes with steroid-induced hyperglycemia: HbA1c 5.9%.  - Continue SSI, will augment to resistant scale due to consistent daytime elevations with fasting CBGs at goal.   History of sacral agenesis.   DVT prophylaxis: Lovenox Code Status: Full  Family Communication: None at bedside Disposition Plan: Home once remdesivir completed, check ambulatory pulse oximetry.  Consultants:   None  Procedures:   None  Antimicrobials:  Remdesivir 11/9 - 11/13   Subjective: On O2 again overnight as substitute for CPAP. No dyspnea at rest, no chest pain, feels he's improving. Fears his wife is staying at home when she's sick to care for their children. He has no new complaints.  Objective: Vitals:   12/25/18 0739 12/25/18 1612 12/25/18 2100 12/26/18 0739  BP: 125/73 126/71 135/78 (!) 108/53  Pulse: 86 87  73  Resp: (!) 23 20  17   Temp: 99.3 F (37.4 C) 98.8 F (37.1 C) 97.9 F (36.6 C) 98.1 F (36.7 C)  TempSrc: Oral Oral Oral Oral  SpO2: (!) 88% 91%  92%  Weight:      Height:       No intake or output data in the 24 hours ending 12/26/18 0807 Filed Weights   12/23/18 0600  Weight: 111.9 kg   Gen: 48 y.o. male in no distress Pulm: Nonlabored breathing room air at rest. Clear. CV: Regular rate and rhythm. No murmur, rub, or gallop. No JVD, no dependent edema. GI: Abdomen soft, non-tender, non-distended, with normoactive bowel sounds.  Ext: Warm, no deformities Skin: No rashes, lesions or ulcers on visualized skin. Neuro: Alert and oriented. No focal neurological deficits. Psych: Judgement and insight appear fair. Mood euthymic & affect congruent. Behavior is appropriate.    Data Reviewed: I have personally reviewed following labs and imaging studies  CBC: Recent Labs  Lab 12/22/18 1406 12/23/18 0553 12/24/18 0311 12/25/18 0125  WBC 8.1 10.6* 19.4* 15.0*  NEUTROABS 5.9 9.2* 16.4* 11.1*  HGB 15.4 15.7 15.1 14.8  HCT 46.6 48.6 47.2 46.5  MCV 90.1 90.2 92.2 93.4  PLT 168 232 265 161   Basic Metabolic Panel: Recent Labs  Lab 12/22/18 1406 12/23/18 0553 12/24/18 0311 12/25/18 0125 12/26/18 0350  NA 138 138 140 140 138  K 4.2 4.2 4.5 4.0 4.3  CL 106 102 103 103 100  CO2 23 24 26 26 27   GLUCOSE 169* 171*  146* 145* 132*  BUN 8 14 17  21* 18  CREATININE 0.74 0.68 0.56* 0.67 0.56*  CALCIUM 9.1 9.3 8.8* 8.6* 8.2*  MG  --   --  2.1 2.3  --    GFR: Estimated Creatinine Clearance: 136.3 mL/min (A) (by C-G formula based on SCr of 0.56 mg/dL (L)). Liver Function Tests: Recent Labs  Lab 12/22/18 1406 12/24/18 0311 12/25/18 0125 12/26/18 0350  AST 21 15 12* 16  ALT 23 23 21 19   ALKPHOS 86 90 88 88  BILITOT 0.8 0.7 0.7 0.7  PROT 6.8 7.1 6.9 6.8  ALBUMIN 3.7 3.7 3.7 3.5   Recent Labs  Lab 12/22/18 1406  LIPASE 35   No results for input(s): AMMONIA in the last 168 hours. Coagulation Profile: No results for input(s): INR, PROTIME in the last 168 hours. Cardiac Enzymes: No results for input(s): CKTOTAL, CKMB, CKMBINDEX, TROPONINI in the last 168 hours. BNP (last 3 results) No results for input(s): PROBNP in the last 8760 hours. HbA1C: No results for input(s): HGBA1C in the last 72 hours. CBG: Recent Labs  Lab 12/24/18 2003 12/25/18 0738 12/25/18 1114 12/25/18 1614 12/25/18 2121  GLUCAP 299* 124* 182* 245* 248*   Lipid Profile: No results for input(s): CHOL, HDL, LDLCALC, TRIG, CHOLHDL, LDLDIRECT in the last 72 hours. Thyroid Function Tests: Recent Labs    12/24/18 0311  TSH 0.113*  FREET4 1.04   Anemia Panel: Recent Labs    12/24/18 0311 12/25/18 0125  FERRITIN 207 212   Urine analysis: No results found for: COLORURINE, APPEARANCEUR, LABSPEC, PHURINE, GLUCOSEU, HGBUR, BILIRUBINUR, KETONESUR, PROTEINUR, UROBILINOGEN, NITRITE, LEUKOCYTESUR Recent Results (from the past 240 hour(s))  Blood culture (routine x 2)     Status: None (Preliminary result)   Collection Time: 12/22/18  3:08 PM   Specimen: BLOOD  Result Value Ref Range Status   Specimen Description BLOOD RIGHT ANTECUBITAL  Final   Special Requests   Final    BOTTLES DRAWN AEROBIC AND ANAEROBIC Blood Culture adequate volume   Culture   Final    NO GROWTH 4 DAYS Performed at [facility], [place]  [address] [place], [place] [address]    Report Status PENDING  Incomplete  Blood culture (routine x 2)     Status: None (Preliminary result)   Collection Time: 12/22/18  3:08 PM   Specimen: BLOOD RIGHT WRIST  Result Value Ref Range Status   Specimen Description BLOOD RIGHT WRIST  Final   Special Requests   Final    BOTTLES DRAWN AEROBIC AND ANAEROBIC Blood Culture adequate volume   Culture   Final    NO GROWTH 4 DAYS Performed at [facility], [place] [address], [place], [place] [address]    Report Status PENDING  Incomplete  SARS CORONAVIRUS 2 (TAT 6-24 HRS) Nasopharyngeal Nasopharyngeal Swab     Status: Abnormal   Collection Time: 12/22/18  5:25 PM   Specimen: Nasopharyngeal Swab  Result Value Ref Range Status   SARS Coronavirus 2 POSITIVE (A) NEGATIVE Final    Comment: RESULT CALLED TO, READ BACK BY AND VERIFIED WITH: MORRIS T, RN AT 2211  ON 12/22/2018 BY SAINVILUS S (NOTE) SARS-CoV-2 target nucleic acids are DETECTED. The SARS-CoV-2 RNA is generally detectable in upper and lower respiratory specimens during the acute phase of infection. Positive results are indicative of active infection with SARS-CoV-2. Clinical  correlation with patient history and other diagnostic information is necessary to determine patient infection status. Positive results do  not rule out bacterial infection or co-infection with other viruses. The expected result is Negative. Fact Sheet for Patients: HairSlick.nohttps://www.fda.gov/media/138098/download Fact Sheet for Healthcare Providers: quierodirigir.comhttps://www.fda.gov/media/138095/download This test is not yet approved or cleared by the [place] FDA and  has been authorized for detection and/or diagnosis of SARS-CoV-2 by FDA under an Emergency Use Authorization (EUA). This EUA will remain  in effect (meaning this test can  be used) for the duration of the COVID-19 declaration under Section 564(b)(1) of the Act, 21 U.S.C. section 360bbb-3(b)(1), unless the  authorization is terminated or revoked sooner. Performed at [facility], [address] [place], [place] [address]       Radiology Studies: No results found.  Scheduled Meds: . aspirin EC  81 mg Oral Q12H  . dexamethasone (DECADRON) injection  6 mg Intravenous Q24H  . enoxaparin (LOVENOX) injection  0.5 mg/kg Subcutaneous Q24H  . insulin aspart  0-20 Units Subcutaneous TID WC  . insulin aspart  0-5 Units Subcutaneous QHS  . Melatonin  3 mg Oral QHS  . polyethylene glycol  17 g Oral BID  . sodium chloride flush  3 mL Intravenous Q12H  . vitamin C  1,000 mg Oral Daily  . zinc sulfate  220 mg Oral Daily   Continuous Infusions: . sodium chloride    . remdesivir 100 mg in NS 250 mL 100 mg (12/25/18 1029)     LOS: 4 days   Time spent: 25 minutes.  Tyrone Nineyan B Maybell Misenheimer, MD [facility] [email or web address] 12/26/2018, 8:07 AM

## 2018-12-27 DIAGNOSIS — F419 Anxiety disorder, unspecified: Secondary | ICD-10-CM

## 2018-12-27 DIAGNOSIS — T380X5A Adverse effect of glucocorticoids and synthetic analogues, initial encounter: Secondary | ICD-10-CM

## 2018-12-27 DIAGNOSIS — R739 Hyperglycemia, unspecified: Secondary | ICD-10-CM

## 2018-12-27 DIAGNOSIS — G4730 Sleep apnea, unspecified: Secondary | ICD-10-CM

## 2018-12-27 DIAGNOSIS — R7303 Prediabetes: Secondary | ICD-10-CM

## 2018-12-27 DIAGNOSIS — J9601 Acute respiratory failure with hypoxia: Secondary | ICD-10-CM

## 2018-12-27 LAB — COMPREHENSIVE METABOLIC PANEL
ALT: 21 U/L (ref 0–44)
AST: 14 U/L — ABNORMAL LOW (ref 15–41)
Albumin: 3.4 g/dL — ABNORMAL LOW (ref 3.5–5.0)
Alkaline Phosphatase: 91 U/L (ref 38–126)
Anion gap: 9 (ref 5–15)
BUN: 20 mg/dL (ref 6–20)
CO2: 29 mmol/L (ref 22–32)
Calcium: 8.6 mg/dL — ABNORMAL LOW (ref 8.9–10.3)
Chloride: 101 mmol/L (ref 98–111)
Creatinine, Ser: 0.57 mg/dL — ABNORMAL LOW (ref 0.61–1.24)
GFR calc Af Amer: >60 mL/min (ref >60)
GFR calc non Af Amer: >60 mL/min (ref >60)
Glucose, Bld: 134 mg/dL — ABNORMAL HIGH (ref 70–99)
Potassium: 4.3 mmol/L (ref 3.5–5.1)
Sodium: 139 mmol/L (ref 135–145)
Total Bilirubin: 0.4 mg/dL (ref 0.3–1.2)
Total Protein: 6.7 g/dL (ref 6.5–8.1)

## 2018-12-27 LAB — CULTURE, BLOOD (ROUTINE X 2)
Culture: NO GROWTH
Culture: NO GROWTH
Special Requests: ADEQUATE
Special Requests: ADEQUATE

## 2018-12-27 LAB — C-REACTIVE PROTEIN: CRP: 2.2 mg/dL — ABNORMAL HIGH (ref <1.0)

## 2018-12-27 LAB — GLUCOSE, CAPILLARY: Glucose-Capillary: 98 mg/dL (ref 70–99)

## 2018-12-27 MED ORDER — DEXAMETHASONE 4 MG PO TABS: 4.0000 mg | ORAL_TABLET | Freq: Every day | ORAL | 0 refills | Status: AC

## 2018-12-27 NOTE — Discharge Instructions (Signed)

## 2018-12-27 NOTE — Discharge Summary (Signed)
Physician Discharge Summary  Jeffery Lloyd WVP:[id number] DOB: [birth year] DOA: 12/22/2018  PCP: Patient, No Pcp Per  Admit date: 12/22/2018 Discharge date: 12/27/2018  Admitted From: Home Disposition: Home   Recommendations for Outpatient Follow-up:  1. Follow up with PCP in 1-2 weeks 2. Please obtain CMP/CBC in one week  Home Health: None Equipment/Devices: None Discharge Condition: Stable CODE STATUS: Full Diet recommendation: Heart healthy  Brief/Interim Summary: Jeffery Lloyd a48 y.o.male,with past medical history significant for arthritis and sacral agenesis and recent diagnosis of COVID-19 on Monday, sent from [place] office for evaluation for hypoxemia, noted today. Chest x-ray at his PCPs office showed worsening bilateral lung abnormalities from a previous chest x-ray. Patient reports left-sided pleuritic chest pain with shortness of breath. No nausea vomiting or diarrhea reported. Symptoms improved on nasal cannula BC and BMP were within normal, chest CT was negative for pulmonary embolism with extensive bilateral heterogeneous and groundglass nodule airspace opacities suggestive of COVID-19.Patient was noted to be tachypneic and tachycardic in the ER  He was admitted with COVID 19 Pneumonia, treated with steroids and remdesivir. Following completion of 5 days of therapy he has had significant improvement in hypoxia and respiratory effort as well as improvement in inflammatory markers. He is stable for discharge with continued treatment with oral steroids.  Discharge Diagnoses:  Principal Problem:   Pneumonia due to COVID-19 virus Active Problems:   Sleep apnea   Acute respiratory failure with hypoxia (HCC)   Anxiety   Prediabetes   Steroid-induced hyperglycemia  Acute hypoxic respiratory failure due to covid-19 pneumonia: Weaned to room air with ambulation for >24hrs at time of discharge.  - Completed remdesivir 11/9 - 11/13. ALT remains wnl. - Continue steroids  x4 more days, will decrease dose to reduce hyperglycemia. - Continue isolated for 21 days from date of positive test - Vitamin C, zinc can be continued  History of sleep apnea:  - Ok to give supplemental oxygen qHS in lieu of NIPPV while admitted with covid. Can return to CPAP as long as no covid negative people are in the room when he goes home.  Sinus tachycardia: Negative PE study, TSH low but T4 normal. Likely reactive/appropriate to infection. Improving overall. - Consider outpatient cardiac monitoring, though this seems to have resolved.   Obesity: BMI 38. Noted  Anxiety:  - Continue clonazepam  Prediabetes with steroid-induced hyperglycemia: HbA1c 5.9%.  - Will taper steroids to avoid hyperglycemia as above.    History of sacral agenesis with neuropathy:  - Continue home pain medications. Urged to avoid NSAIDs if able, though risk/benefit with significant pain and recovering from covid is unclear.  Discharge Instructions Discharge Instructions    Diet - low sodium heart healthy   Complete by: As directed    Discharge instructions   Complete by: As directed    You are being discharged from the hospital after treatment for covid-19 infection. You are felt to be stable enough to no longer require inpatient monitoring, testing, and treatment, though you will need to follow the recommendations below: - Continue taking decadron for 4 more days starting tomorrow. This steroid can increase blood sugar, so a lower dose is being sent to your pharmacy.  - Remain in self-isolation for 21 days from the date of your first positive test. - Follow up with your doctor in the next week via telehealth or seek medical attention right away if your symptoms get WORSE.  - Consider donating plasma after you have recovered (either 14 days after a negative test  or 28 days after symptoms have completely resolved) because your antibodies to this virus may be helpful to give to others with  life-threatening infections. Please go to the website [email or web address] if you would like to consider volunteering for plasma donation.    Directions for you at home:  Wear a facemask You should wear a facemask that covers your nose and mouth when you are in the same room with other people and when you visit a healthcare provider. People who live with or visit you should also wear a facemask while they are in the same room with you.  Separate yourself from other people in your home As much as possible, you should stay in a different room from other people in your home. Also, you should use a separate bathroom, if available.  Avoid sharing household items You should not share dishes, drinking glasses, cups, eating utensils, towels, bedding, or other items with other people in your home. After using these items, you should wash them thoroughly with soap and water.  Cover your coughs and sneezes Cover your mouth and nose with a tissue when you cough or sneeze, or you can cough or sneeze into your sleeve. Throw used tissues in a lined trash can, and immediately wash your hands with soap and water for at least 20 seconds or use an alcohol-based hand rub.  Wash your Union Pacific Corporationhands Wash your hands often and thoroughly with soap and water for at least 20 seconds. You can use an alcohol-based hand sanitizer if soap and water are not available and if your hands are not visibly dirty. Avoid touching your eyes, nose, and mouth with unwashed hands.  Directions for those who live with, or provide care at home for you:  Limit the number of people who have contact with the patient If possible, have only one caregiver for the patient. Other household members should stay in another home or place of residence. If this is not possible, they should stay in another room, or be separated from the patient as much as possible. Use a separate bathroom, if available. Restrict visitors who do not have an essential need  to be in the home.  Ensure good ventilation Make sure that shared spaces in the home have good air flow, such as from an air conditioner or an opened window, weather permitting.  Wash your hands often Wash your hands often and thoroughly with soap and water for at least 20 seconds. You can use an alcohol based hand sanitizer if soap and water are not available and if your hands are not visibly dirty. Avoid touching your eyes, nose, and mouth with unwashed hands. Use disposable paper towels to dry your hands. If not available, use dedicated cloth towels and replace them when they become wet.  Wear a facemask and gloves Wear a disposable facemask at all times in the room and gloves when you touch or have contact with the patient's blood, body fluids, and/or secretions or excretions, such as sweat, saliva, sputum, nasal mucus, vomit, urine, or feces.  Ensure the mask fits over your nose and mouth tightly, and do not touch it during use. Throw out disposable facemasks and gloves after using them. Do not reuse. Wash your hands immediately after removing your facemask and gloves. If your personal clothing becomes contaminated, carefully remove clothing and launder. Wash your hands after handling contaminated clothing. Place all used disposable facemasks, gloves, and other waste in a lined container before disposing them with other household waste. Remove gloves  and wash your hands immediately after handling these items.  Do not share dishes, glasses, or other household items with the patient Avoid sharing household items. You should not share dishes, drinking glasses, cups, eating utensils, towels, bedding, or other items with a patient who is confirmed to have, or being evaluated for, COVID-19 infection. After the person uses these items, you should wash them thoroughly with soap and water.  Wash laundry thoroughly Immediately remove and wash clothes or bedding that have blood, body fluids, and/or  secretions or excretions, such as sweat, saliva, sputum, nasal mucus, vomit, urine, or feces, on them. Wear gloves when handling laundry from the patient. Read and follow directions on labels of laundry or clothing items and detergent. In general, wash and dry with the warmest temperatures recommended on the label.  Clean all areas the individual has used often Clean all touchable surfaces, such as counters, tabletops, doorknobs, bathroom fixtures, toilets, phones, keyboards, tablets, and bedside tables, every day. Also, clean any surfaces that may have blood, body fluids, and/or secretions or excretions on them. Wear gloves when cleaning surfaces the patient has come in contact with. Use a diluted bleach solution (e.g., dilute bleach with 1 part bleach and 10 parts water) or a household disinfectant with a label that says EPA-registered for coronaviruses. To make a bleach solution at home, add 1 tablespoon of bleach to 1 quart (4 cups) of water. For a larger supply, add  cup of bleach to 1 gallon (16 cups) of water. Read labels of cleaning products and follow recommendations provided on product labels. Labels contain instructions for safe and effective use of the cleaning product including precautions you should take when applying the product, such as wearing gloves or eye protection and making sure you have good ventilation during use of the product. Remove gloves and wash hands immediately after cleaning.  Monitor yourself for signs and symptoms of illness Caregivers and household members are considered close contacts, should monitor their health, and will be asked to limit movement outside of the home to the extent possible. Follow the monitoring steps for close contacts listed on the symptom monitoring form.  If you have additional questions, contact your local health department or call the epidemiologist on call at [phone] (available 24/7). This guidance is subject to change. For the  most up-to-date guidance from [facility], please refer to their website: TripMetro.hu   Increase activity slowly   Complete by: As directed    MyChart COVID-19 home monitoring program   Complete by: Dec 27, 2018    Is the patient willing to use the MyChart Mobile App for home monitoring?: Yes     Allergies as of 12/27/2018      Reactions   Codeine       Medication List    TAKE these medications   acetaminophen 500 MG tablet Commonly known as: TYLENOL Take 1,000 mg by mouth every 6 (six) hours as needed for mild pain or headache.   cholecalciferol 25 MCG (1000 UT) tablet Commonly known as: VITAMIN D3 Take 2,000 Units by mouth daily.   clonazePAM 1 MG tablet Commonly known as: KLONOPIN Take 1 mg by mouth at bedtime as needed for sleep.   dexamethasone 4 MG tablet Commonly known as: Decadron Take 1 tablet (4 mg total) by mouth daily for 4 days. Start taking on: December 28, 2018   dextromethorphan-guaiFENesin 30-600 MG 12hr tablet Commonly known as: MUCINEX DM Take 1 tablet by mouth 2 (two) times daily as needed for  cough.   fexofenadine 180 MG tablet Commonly known as: ALLEGRA Take 180 mg by mouth daily as needed for allergies or rhinitis.   Fish Oil 1000 MG Caps Take 1,000 mg by mouth daily.   ibuprofen 200 MG tablet Commonly known as: ADVIL Take 600 mg by mouth every 6 (six) hours as needed for headache or moderate pain.   meloxicam 15 MG tablet Commonly known as: MOBIC Take 15 mg by mouth daily as needed for pain.   vitamin C 500 MG tablet Commonly known as: ASCORBIC ACID Take 500 mg by mouth daily.   zinc gluconate 50 MG tablet Take 50 mg by mouth daily.       Allergies  Allergen Reactions  . Codeine     Consultations:  None  Procedures/Studies: Ct Angio Chest Pe W And/or Wo Contrast  Result Date: 12/22/2018 CLINICAL DATA:  Shortness of breath, COVID-19 positive, abnormal chest x-ray  EXAM: CT ANGIOGRAPHY CHEST WITH CONTRAST TECHNIQUE: Multidetector CT imaging of the chest was performed using the standard protocol during bolus administration of intravenous contrast. Multiplanar CT image reconstructions and MIPs were obtained to evaluate the vascular anatomy. CONTRAST:  75mL OMNIPAQUE IOHEXOL 350 MG/ML SOLN COMPARISON:  Same day chest radiograph FINDINGS: Cardiovascular: Satisfactory opacification of the pulmonary arteries to the segmental level. No evidence of pulmonary embolism. Normal heart size. No pericardial effusion. Mediastinum/Nodes: No enlarged mediastinal, hilar, or axillary lymph nodes. Thyroid gland, trachea, and esophagus demonstrate no significant findings. Lungs/Pleura: There is extensive bilateral heterogeneous and ground-glass nodule airspace opacity, many opacities in a subpleural distribution. No pleural effusion or pneumothorax. Upper Abdomen: No acute abnormality. Musculoskeletal: No chest wall abnormality. 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 Review of the MIP images confirms the above findings. IMPRESSION: 1.  Negative examination for pulmonary embolism. 2. There is extensive bilateral heterogeneous and ground-glass nodule airspace opacity, many opacities in a subpleural distribution, airspace disease generally in keeping with reported diagnosis of COVID-19. Electronically Signed   By: Lauralyn Primes M.D.   On: 12/22/2018 15:26    Subjective: Feels well, no chest pain. Ready to go home, took a shower and feels tired but tolerated well.   Discharge Exam: Vitals:   12/27/18 0549 12/27/18 1127  BP: 119/78 114/62  Pulse:  82  Resp:  (!) 21  Temp: 98 F (36.7 C) 98.6 F (37 C)  SpO2:  91%   General: Pt is alert, awake, not in acute distress Cardiovascular: RRR, S1/S2 +, no rubs, no gallops Respiratory: CTA bilaterally, no wheezing, no rhonchi Abdominal: Soft, NT, ND, bowel sounds + Extremities: No edema, no cyanosis. No significant  findings on hands visually  Labs: BNP (last 3 results) No results for input(s): BNP in the last 8760 hours. Basic Metabolic Panel: Recent Labs  Lab 12/23/18 0553 12/24/18 0311 12/25/18 0125 12/26/18 0350 12/27/18 0311  NA 138 140 140 138 139  K 4.2 4.5 4.0 4.3 4.3  CL 102 103 103 100 101  CO2 GLUCOSE 171* 146* 145* 132* 134*  BUN 14 17 21* 18 20  CREATININE 0.68 0.56* 0.67 0.56* 0.57*  CALCIUM 9.3 8.8* 8.6* 8.2* 8.6*  MG  --  2.1 2.3  --   --    Liver Function Tests: Recent Labs  Lab 12/22/18 1406 12/24/18 0311 12/25/18 0125 12/26/18 0350 12/27/18 0311  AST 21 15 12* 16 14*  ALT ALKPHOS 86 90 88 88 91  BILITOT  0.8 0.7 0.7 0.7 0.4  PROT 6.8 7.1 6.9 6.8 6.7  ALBUMIN 3.7 3.7 3.7 3.5 3.4*   Recent Labs  Lab 12/22/18 1406  LIPASE 35   No results for input(s): AMMONIA in the last 168 hours. CBC: Recent Labs  Lab 12/22/18 1406 12/23/18 0553 12/24/18 0311 12/25/18 0125  WBC 8.1 10.6* 19.4* 15.0*  NEUTROABS 5.9 9.2* 16.4* 11.1*  HGB 15.4 15.7 15.1 14.8  HCT 46.6 48.6 47.2 46.5  MCV 90.1 90.2 92.2 93.4  PLT 168 232 265 245   Cardiac Enzymes: No results for input(s): CKTOTAL, CKMB, CKMBINDEX, TROPONINI in the last 168 hours. BNP: Invalid input(s): POCBNP CBG: Recent Labs  Lab 12/26/18 0802 12/26/18 1134 12/26/18 1627 12/26/18 2054 12/27/18 0800  GLUCAP 122* 202* 215* 244* 98   D-Dimer Recent Labs    12/25/18 0125  DDIMER 0.47   Hgb A1c No results for input(s): HGBA1C in the last 72 hours. Lipid Profile No results for input(s): CHOL, HDL, LDLCALC, TRIG, CHOLHDL, LDLDIRECT in the last 72 hours. Thyroid function studies No results for input(s): TSH, T4TOTAL, T3FREE, THYROIDAB in the last 72 hours.  Invalid input(s): FREET3 Anemia work up Recent Labs    12/25/18 0125  FERRITIN 212   Urinalysis No results found for: COLORURINE, APPEARANCEUR, LABSPEC, PHURINE, GLUCOSEU, HGBUR, BILIRUBINUR, KETONESUR,  PROTEINUR, UROBILINOGEN, NITRITE, LEUKOCYTESUR  Microbiology Recent Results (from the past 240 hour(s))  Blood culture (routine x 2)     Status: None   Collection Time: 12/22/18  3:08 PM   Specimen: BLOOD  Result Value Ref Range Status   Specimen Description BLOOD RIGHT ANTECUBITAL  Final   Special Requests   Final    BOTTLES DRAWN AEROBIC AND ANAEROBIC Blood Culture adequate volume   Culture   Final    NO GROWTH 5 DAYS Performed at [facility], [address] [place], [place] [address]    Report Status 12/27/2018 FINAL  Final  Blood culture (routine x 2)     Status: None   Collection Time: 12/22/18  3:08 PM   Specimen: BLOOD RIGHT WRIST  Result Value Ref Range Status   Specimen Description BLOOD RIGHT WRIST  Final   Special Requests   Final    BOTTLES DRAWN AEROBIC AND ANAEROBIC Blood Culture adequate volume   Culture   Final    NO GROWTH 5 DAYS Performed at [facility], [address] [place], [place] [address]    Report Status 12/27/2018 FINAL  Final  SARS CORONAVIRUS 2 (TAT 6-24 HRS) Nasopharyngeal Nasopharyngeal Swab     Status: Abnormal   Collection Time: 12/22/18  5:25 PM   Specimen: Nasopharyngeal Swab  Result Value Ref Range Status   SARS Coronavirus 2 POSITIVE (A) NEGATIVE Final    Comment: RESULT CALLED TO, READ BACK BY AND VERIFIED WITH: MORRIS T, RN AT 2211 ON 12/22/2018 BY SAINVILUS S (NOTE) SARS-CoV-2 target nucleic acids are DETECTED. The SARS-CoV-2 RNA is generally detectable in upper and lower respiratory specimens during the acute phase of infection. Positive results are indicative of active infection with SARS-CoV-2. Clinical  correlation with patient history and other diagnostic information is necessary to determine patient infection status. Positive results do  not rule out bacterial infection or co-infection with other viruses. The expected result is Negative. Fact Sheet for Patients: HairSlick.no Fact  Sheet for Healthcare Providers: quierodirigir.com This test is not yet approved or cleared by the [place] FDA and  has been authorized for detection and/or diagnosis of  SARS-CoV-2 by FDA under an Emergency Use Authorization (EUA). This EUA will remain  in effect (meaning this test can  be used) for the duration of the COVID-19 declaration under Section 564(b)(1) of the Act, 21 U.S.C. section 360bbb-3(b)(1), unless the authorization is terminated or revoked sooner. Performed at [facility], [address] [place], [place] [address]     Time coordinating discharge: Approximately 40 minutes  Tyrone Nine, MD  [facility] 12/27/2018, 11:59 AM

## 2018-12-27 NOTE — Plan of Care (Signed)
  Problem: Education: Goal: Knowledge of risk factors and measures for prevention of condition will improve Outcome: Progressing   Problem: Coping: Goal: Psychosocial and spiritual needs will be supported Outcome: Progressing   Problem: Respiratory: Goal: Will maintain a patent airway Outcome: Progressing Goal: Complications related to the disease process, condition or treatment will be avoided or minimized Outcome: Progressing   

## 2018-12-27 NOTE — Progress Notes (Signed)
SATURATION QUALIFICATIONS: (This note is used to comply with regulatory documentation for home oxygen)  Patient Saturations on Room Air at Rest = 96%  Patient Saturations on Room Air while Ambulating = 95%  

## 2021-04-20 IMAGING — CT CT ANGIO CHEST
2 of 7 series · 18 of 46 positions shown · IV contrast (omnipaque)
Comparison: Same day chest radiograph

CLINICAL DATA: Shortness of breath, [id number]-WP positive, abnormal
chest x-ray

EXAM:
CT ANGIOGRAPHY CHEST WITH CONTRAST
TECHNIQUE: Multidetector CT imaging of the chest was performed using the
standard protocol during bolus administration of intravenous
contrast. Multiplanar CT image reconstructions and MIPs were
obtained to evaluate the vascular anatomy.
CONTRAST:  75mL OMNIPAQUE IOHEXOL 350 MG/ML SOLN

[Series 7: thins · axial · 0.77mm/px · z∈[+1194,+1435]mm · 15 of 273 slices shown]
[im 16/273  lung]
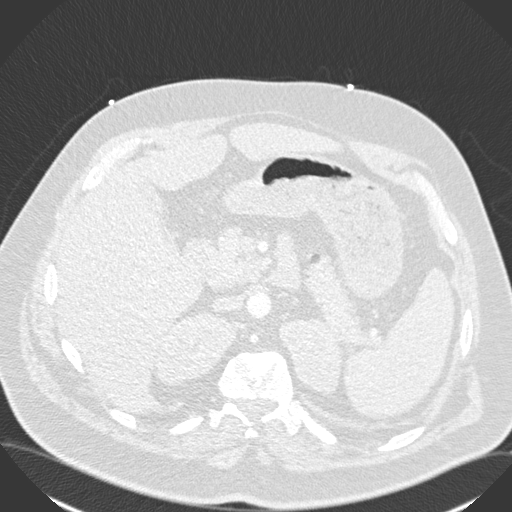
[im 31/273  soft-tissue]
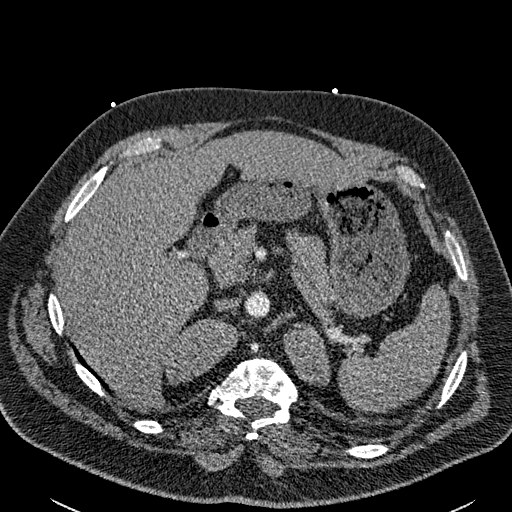
[im 46/273  lung]
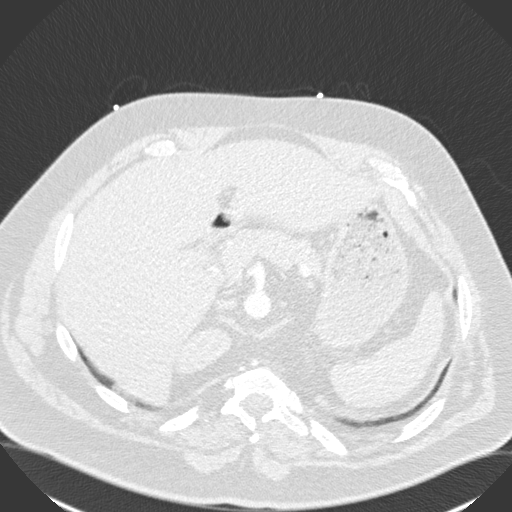
[im 61/273  soft-tissue]
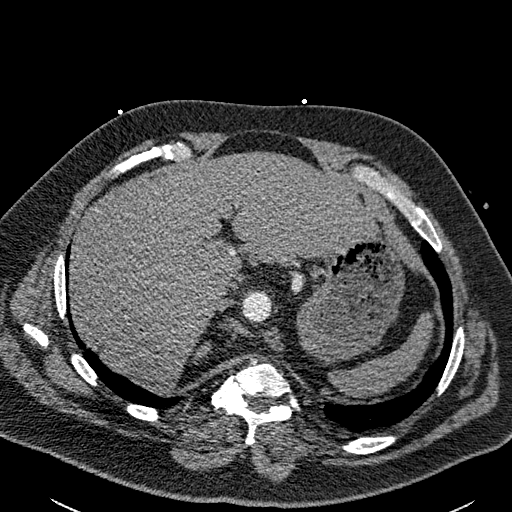
[im 91/273  lung]
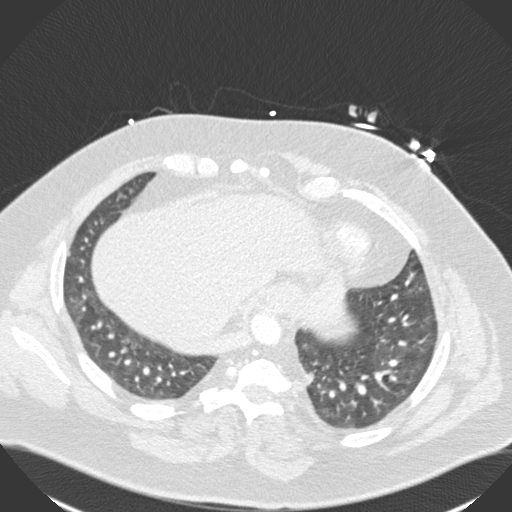
[im 106/273  soft-tissue]
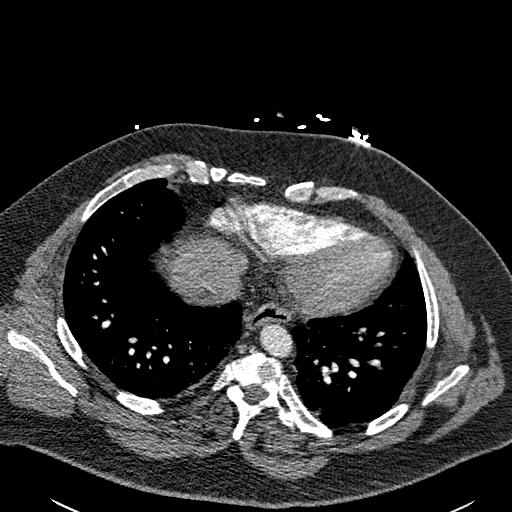
[im 121/273  lung]
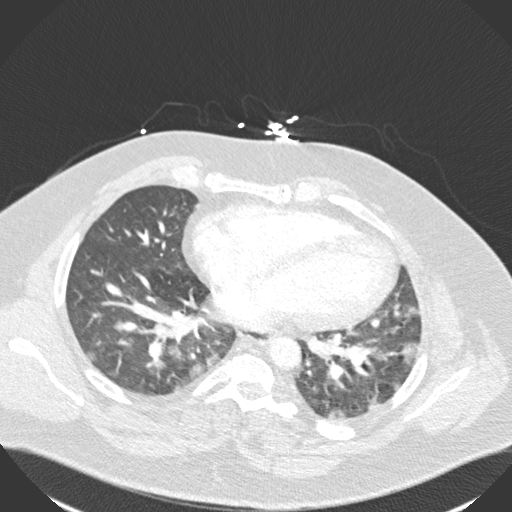
[im 137/273  soft-tissue]
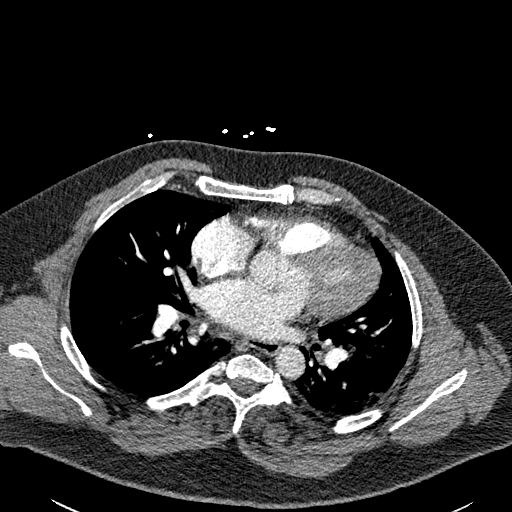
[im 152/273  lung]
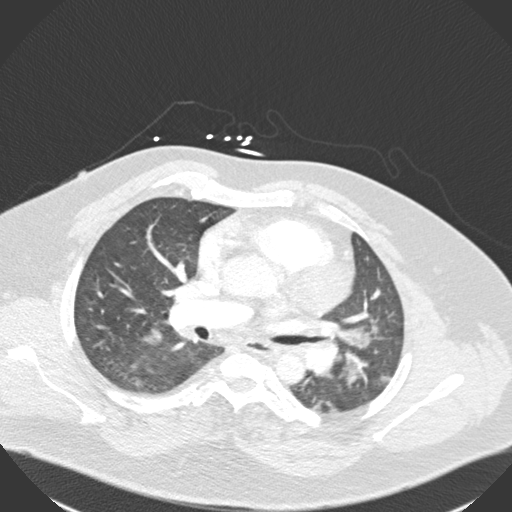
[im 167/273  soft-tissue]
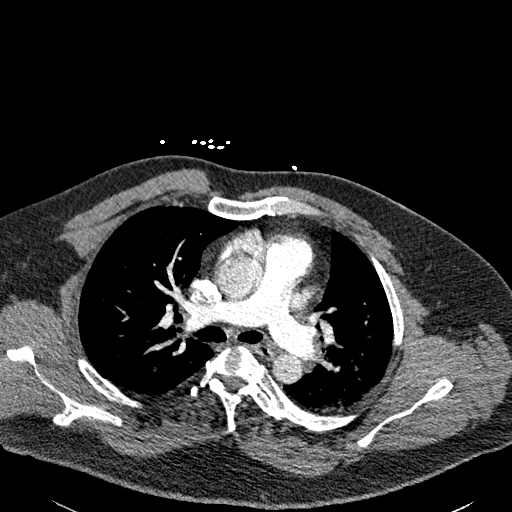
[im 182/273  lung]
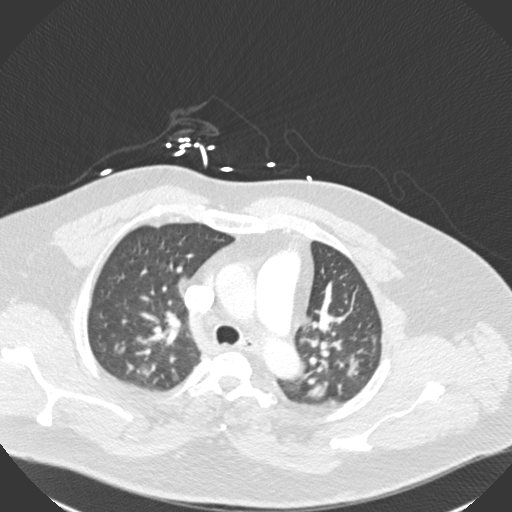
[im 212/273  soft-tissue]
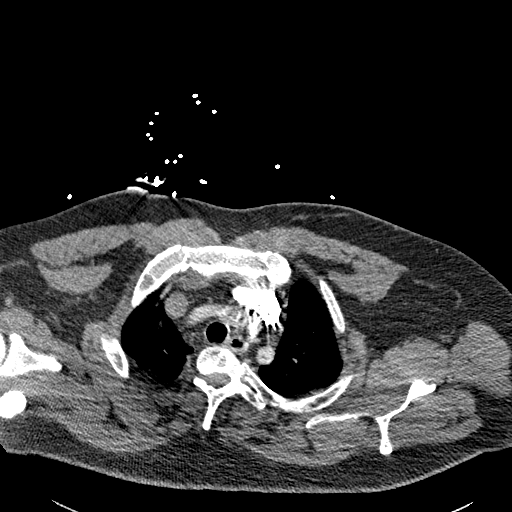
[im 227/273  lung]
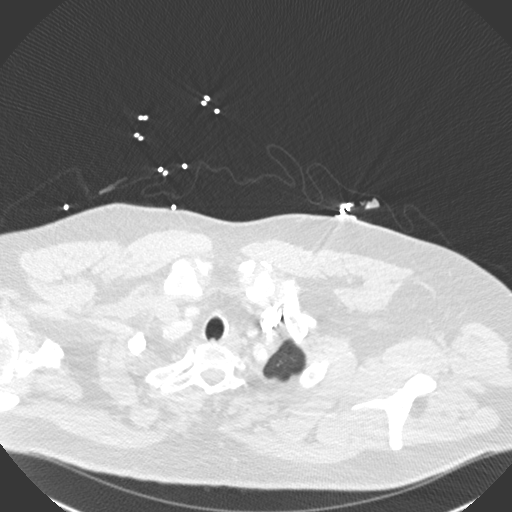
[im 242/273  soft-tissue]
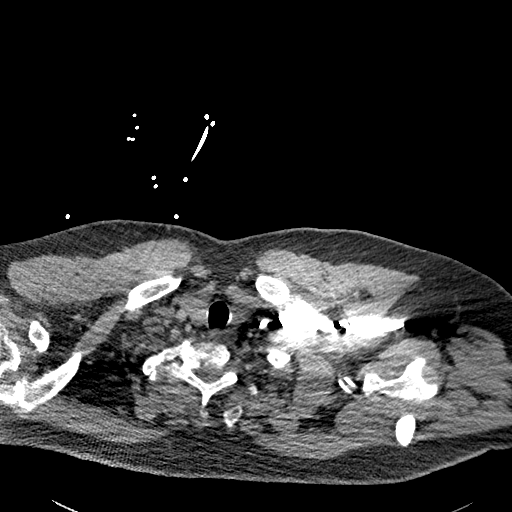
[im 257/273  lung]
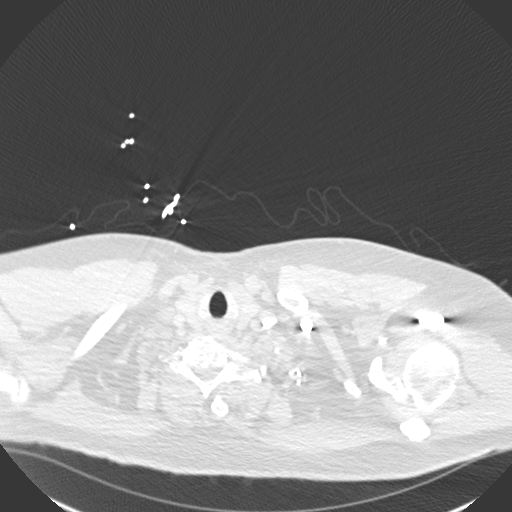

[Series 9: coronal mpr · coronal · 0.59mm/px · 3 of 151 slices shown]
[im 38/151  soft-tissue]
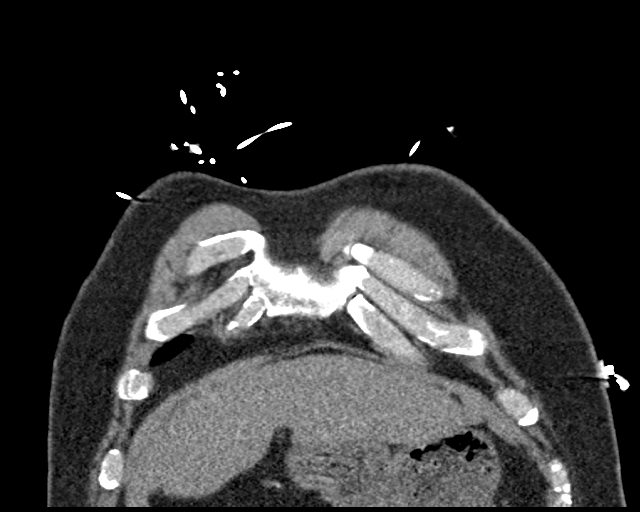
[im 76/151  soft-tissue]
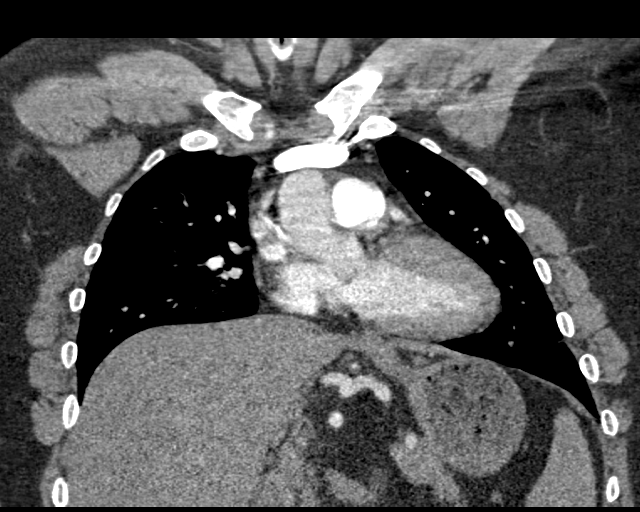
[im 113/151  soft-tissue]
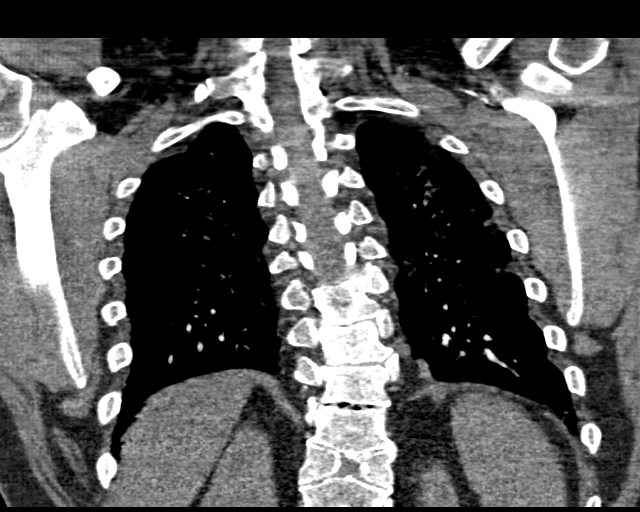

[18 of 46 positions shown; findings below may reference images not displayed]

FINDINGS: Cardiovascular: Satisfactory opacification of the pulmonary arteries
to the segmental level. No evidence of pulmonary embolism. Normal
heart size. No pericardial effusion.

Mediastinum/Nodes: No enlarged mediastinal, hilar, or axillary lymph
nodes. Thyroid gland, trachea, and esophagus demonstrate no
significant findings.

Lungs/Pleura: There is extensive bilateral heterogeneous and
ground-glass nodule airspace opacity, many opacities in a subpleural
distribution. No pleural effusion or pneumothorax.

Upper Abdomen: No acute abnormality.

Musculoskeletal: No chest wall abnormality. Incidental note of
butterfly variant vertebral body of T11.

Review of the MIP images confirms the above findings.
IMPRESSION: 1.  Negative examination for pulmonary embolism.

2. There is extensive bilateral heterogeneous and ground-glass
nodule airspace opacity, many opacities in a subpleural
distribution, airspace disease generally in keeping with reported
diagnosis of [id number]-WP.
